# Patient Record
Sex: Female | Born: 1990 | Race: Black or African American | Hispanic: No | Marital: Married | Smoking: Never smoker
Health system: Southern US, Community
[De-identification: ages and names within clinical notes are randomized; demographics above are authoritative.]

## PROBLEM LIST (undated history)

## (undated) DIAGNOSIS — Z789 Other specified health status: Secondary | ICD-10-CM

## (undated) HISTORY — PX: NO PAST SURGERIES: SHX2092

## (undated) HISTORY — DX: Other specified health status: Z78.9

---
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
## 2011-05-25 ENCOUNTER — Emergency Department (INDEPENDENT_AMBULATORY_CARE_PROVIDER_SITE_OTHER)
Admission: EM | Admit: 2011-05-25 | Discharge: 2011-05-25 | Disposition: A | Discharge status: Home / Self Care | Payer: PRIVATE HEALTH INSURANCE | Source: Home / Self Care | Attending: Emergency Medicine | Admitting: Emergency Medicine

## 2011-05-25 ENCOUNTER — Encounter: Payer: Self-pay | Admitting: Emergency Medicine

## 2011-05-25 DIAGNOSIS — L03319 Cellulitis of trunk, unspecified: Secondary | ICD-10-CM

## 2011-05-25 DIAGNOSIS — L02215 Cutaneous abscess of perineum: Secondary | ICD-10-CM

## 2011-05-25 MED ORDER — SULFAMETHOXAZOLE-TRIMETHOPRIM 800-160 MG PO TABS: 1.0000 | ORAL_TABLET | Freq: Two times a day (BID) | ORAL | Status: AC

## 2011-05-25 MED ORDER — IBUPROFEN 400 MG PO TABS: 400.0000 mg | ORAL_TABLET | Freq: Four times a day (QID) | ORAL | Status: AC | PRN

## 2011-05-25 NOTE — ED Provider Notes (Signed)
History     CSN: [id number]  Arrival date & time 05/25/11  1706   First MD Initiated Contact with Patient 05/25/11 1709      Chief Complaint  Patient presents with  . Recurrent Skin Infections    (Consider location/radiation/quality/duration/timing/severity/associated sxs/prior treatment) HPI Comments: Have had other abscesses, in the same are, but they usually resolve on their own" No fevers, and this one is not draining  Patient is a 21 y.o. female presenting with abscess. The history is provided by the patient.  Abscess  This is a new problem. The problem has been gradually worsening. The problem is moderate. The abscess is characterized by redness, draining and swelling. Pertinent negatives include no anorexia and no fever.    History reviewed. No pertinent past medical history.  History reviewed. No pertinent past surgical history.  No family history on file.  History  Substance Use Topics  . Smoking status: Never Smoker   . Smokeless tobacco: Not on file  . Alcohol Use: No    OB History    Grav Para Term Preterm Abortions TAB SAB Ect Mult Living                  Review of Systems  Constitutional: Negative for fever.  Gastrointestinal: Negative for anorexia.  Skin:       Cellulitis abscess    Allergies  Review of patient's allergies indicates no known allergies.  Home Medications   Current Outpatient Rx  Name Route Sig Dispense Refill  . IBUPROFEN 400 MG PO TABS Oral Take 1 tablet (400 mg total) by mouth every 6 (six) hours as needed for pain. 30 tablet 0  . SULFAMETHOXAZOLE-TRIMETHOPRIM 800-160 MG PO TABS Oral Take 1 tablet by mouth 2 (two) times daily. 14 tablet 0    BP 93/79  Pulse 118  Temp(Src) 99.4 F (37.4 C) (Oral)  Resp 20  SpO2 100%  LMP 05/23/2011  Physical Exam  Nursing note and vitals reviewed. Constitutional: She appears well-developed and well-nourished.  Abdominal: Soft. Hernia confirmed negative in the right inguinal area and  confirmed negative in the left inguinal area.  Genitourinary:     Lymphadenopathy:       Right: No inguinal adenopathy present.       Left: No inguinal adenopathy present.  Skin: There is erythema.    ED Course  INCISION AND DRAINAGE Performed by: Andres Bantz Authorized by: Jimmie Molly Consent: Verbal consent obtained. Consent given by: patient and parent Patient understanding: patient states understanding of the procedure being performed Patient identity confirmed: verbally with patient Type: abscess Body area: anogenital Anesthesia: local infiltration Local anesthetic: bupivacaine 0.5% without epinephrine Anesthetic total: 4 ml Patient sedated: no Scalpel size: 10 Incision type: single straight Complexity: complex Drainage: purulent Drainage amount: copious Wound treatment: wound left open Patient tolerance: Patient tolerated the procedure well with no immediate complications.   (including critical care time)  Labs Reviewed - No data to display No results found.   1. Abscess, perineum       MDM  L abscess, perineal area- recurrent seem to have had recurrent-infections same area before- seems to have appearance of an infected sebaceous cyst        Jimmie Molly, MD 05/25/11 [phone]

## 2011-05-25 NOTE — ED Notes (Signed)
Boil/abscess , right labia.  Large, red painful area

## 2011-05-28 LAB — CULTURE, ROUTINE-GENITAL

## 2016-01-08 LAB — OB RESULTS CONSOLE GC/CHLAMYDIA
Chlamydia: NEGATIVE
Gonorrhea: NEGATIVE

## 2016-01-08 LAB — OB RESULTS CONSOLE RUBELLA ANTIBODY, IGM: Rubella: IMMUNE

## 2016-01-08 LAB — OB RESULTS CONSOLE HIV ANTIBODY (ROUTINE TESTING): HIV: NONREACTIVE

## 2016-01-08 LAB — OB RESULTS CONSOLE ABO/RH: RH TYPE: POSITIVE

## 2016-01-08 LAB — OB RESULTS CONSOLE RPR: RPR: NONREACTIVE

## 2016-01-08 LAB — OB RESULTS CONSOLE ANTIBODY SCREEN: Antibody Screen: NEGATIVE

## 2016-01-08 LAB — OB RESULTS CONSOLE HEPATITIS B SURFACE ANTIGEN: HEP B S AG: NEGATIVE

## 2016-05-20 NOTE — L&D Delivery Note (Signed)
Delivery Note Pt reassessed when deep variables returned and found to have a reducible lip.  Given the tracing, we began pushing and the lip reduced.  She pushed very well for about 20 minutes and brought the vertex to crowning.  At 5:38 PM a viable female was delivered via  (Presentation: OA).  APGAR: 8,9 ; weight pending .   Placenta status:  Delivered spontaneously .  Cord:  with the following complications: tight nuchal x 1 delivered through.  Baby with spontaneous cry and waited for delayed cord clamping. Anesthesia:  epidural Episiotomy:  none Lacerations:  First degree vaginal Suture Repair: 3.0 vicryl rapide for hemostasis Est. Blood Loss (mL):  225ml  Mom to postpartum.  Baby to Couplet care / Skin to Skin. D/w parents circumcision and they decline   Brandi Gross W 07/30/2016, 6:42 PM

## 2016-07-12 LAB — OB RESULTS CONSOLE GBS: GBS: POSITIVE

## 2016-07-29 ENCOUNTER — Encounter (HOSPITAL_COMMUNITY): Payer: Self-pay | Admitting: *Deleted

## 2016-07-29 ENCOUNTER — Telehealth (HOSPITAL_COMMUNITY): Payer: Self-pay | Admitting: *Deleted

## 2016-07-29 NOTE — Telephone Encounter (Signed)
Preadmission screen  

## 2016-07-30 ENCOUNTER — Inpatient Hospital Stay (HOSPITAL_COMMUNITY): Payer: Medicaid Other | Admitting: Anesthesiology

## 2016-07-30 ENCOUNTER — Encounter (HOSPITAL_COMMUNITY): Payer: Self-pay

## 2016-07-30 ENCOUNTER — Inpatient Hospital Stay (HOSPITAL_COMMUNITY)
Admission: AD | Admit: 2016-07-30 | Discharge: 2016-08-01 | DRG: 775 | Disposition: A | Discharge status: Home / Self Care | Payer: Medicaid Other | Source: Ambulatory Visit | Attending: Obstetrics and Gynecology | Admitting: Obstetrics and Gynecology

## 2016-07-30 DIAGNOSIS — Z3A38 38 weeks gestation of pregnancy: Secondary | ICD-10-CM

## 2016-07-30 DIAGNOSIS — O4292 Full-term premature rupture of membranes, unspecified as to length of time between rupture and onset of labor: Secondary | ICD-10-CM | POA: Diagnosis present

## 2016-07-30 DIAGNOSIS — O99824 Streptococcus B carrier state complicating childbirth: Secondary | ICD-10-CM | POA: Diagnosis present

## 2016-07-30 LAB — POCT FERN TEST: POCT Fern Test: POSITIVE

## 2016-07-30 LAB — ABO/RH: ABO/RH(D): O POS

## 2016-07-30 LAB — CBC
HEMATOCRIT: 35 % — AB (ref 36.0–46.0)
Hemoglobin: 11.5 g/dL — ABNORMAL LOW (ref 12.0–15.0)
MCH: 29.1 pg (ref 26.0–34.0)
MCHC: 32.9 g/dL (ref 30.0–36.0)
MCV: 88.6 fL (ref 78.0–100.0)
PLATELETS: 250 10*3/uL (ref 150–400)
RBC: 3.95 MIL/uL (ref 3.87–5.11)
RDW: 12.8 % (ref 11.5–15.5)
WBC: 8.3 10*3/uL (ref 4.0–10.5)

## 2016-07-30 LAB — TYPE AND SCREEN
ABO/RH(D): O POS
Antibody Screen: NEGATIVE

## 2016-07-30 MED ORDER — COCONUT OIL OIL
1.0000 "application " | TOPICAL_OIL | Status: DC | PRN
  Administered 2016-07-31: 1 via TOPICAL
  Filled 2016-07-30: qty 120

## 2016-07-30 MED ORDER — BUTORPHANOL TARTRATE 1 MG/ML IJ SOLN
1.0000 mg | INTRAMUSCULAR | Status: DC | PRN
  Administered 2016-07-30: 1 mg via INTRAVENOUS
  Filled 2016-07-30: qty 1

## 2016-07-30 MED ORDER — TERBUTALINE SULFATE 1 MG/ML IJ SOLN
0.2500 mg | Freq: Once | INTRAMUSCULAR | Status: DC | PRN
  Filled 2016-07-30: qty 1

## 2016-07-30 MED ORDER — SOD CITRATE-CITRIC ACID 500-334 MG/5ML PO SOLN
30.0000 mL | ORAL | Status: DC | PRN
  Filled 2016-07-30: qty 15

## 2016-07-30 MED ORDER — EPHEDRINE 5 MG/ML INJ
10.0000 mg | INTRAVENOUS | Status: DC | PRN
  Filled 2016-07-30: qty 4

## 2016-07-30 MED ORDER — BENZOCAINE-MENTHOL 20-0.5 % EX AERO
1.0000 "application " | INHALATION_SPRAY | CUTANEOUS | Status: DC | PRN
  Filled 2016-07-30: qty 56

## 2016-07-30 MED ORDER — LACTATED RINGERS IV SOLN
500.0000 mL | INTRAVENOUS | Status: DC | PRN
  Administered 2016-07-30: 1000 mL via INTRAVENOUS

## 2016-07-30 MED ORDER — ZOLPIDEM TARTRATE 5 MG PO TABS: 5.0000 mg | ORAL_TABLET | Freq: Every evening | ORAL | Status: DC | PRN

## 2016-07-30 MED ORDER — ONDANSETRON HCL 4 MG/2ML IJ SOLN
4.0000 mg | Freq: Four times a day (QID) | INTRAMUSCULAR | Status: DC | PRN
  Administered 2016-07-30: 4 mg via INTRAVENOUS
  Filled 2016-07-30: qty 2

## 2016-07-30 MED ORDER — ONDANSETRON HCL 4 MG PO TABS: 4.0000 mg | ORAL_TABLET | ORAL | Status: DC | PRN

## 2016-07-30 MED ORDER — SENNOSIDES-DOCUSATE SODIUM 8.6-50 MG PO TABS
2.0000 | ORAL_TABLET | ORAL | Status: DC
  Administered 2016-07-30 – 2016-07-31 (×2): 2 via ORAL
  Filled 2016-07-30 (×2): qty 2

## 2016-07-30 MED ORDER — PENICILLIN G POT IN DEXTROSE 60000 UNIT/ML IV SOLN
3.0000 10*6.[IU] | INTRAVENOUS | Status: DC
  Administered 2016-07-30 (×3): 3 10*6.[IU] via INTRAVENOUS
  Filled 2016-07-30 (×6): qty 50

## 2016-07-30 MED ORDER — LACTATED RINGERS IV SOLN
INTRAVENOUS | Status: DC
  Administered 2016-07-30: 07:00:00 via INTRAUTERINE

## 2016-07-30 MED ORDER — OXYCODONE HCL 5 MG PO TABS: 10.0000 mg | ORAL_TABLET | ORAL | Status: DC | PRN

## 2016-07-30 MED ORDER — FENTANYL 2.5 MCG/ML BUPIVACAINE 1/10 % EPIDURAL INFUSION (WH - ANES): 14.0000 mL/h | INTRAMUSCULAR | Status: DC | PRN

## 2016-07-30 MED ORDER — FLEET ENEMA 7-19 GM/118ML RE ENEM: 1.0000 | ENEMA | RECTAL | Status: DC | PRN

## 2016-07-30 MED ORDER — FENTANYL 2.5 MCG/ML BUPIVACAINE 1/10 % EPIDURAL INFUSION (WH - ANES)
INTRAMUSCULAR | Status: AC
  Filled 2016-07-30: qty 100

## 2016-07-30 MED ORDER — OXYCODONE-ACETAMINOPHEN 5-325 MG PO TABS: 1.0000 | ORAL_TABLET | ORAL | Status: DC | PRN

## 2016-07-30 MED ORDER — OXYTOCIN 40 UNITS IN LACTATED RINGERS INFUSION - SIMPLE MED: 1.0000 m[IU]/min | INTRAVENOUS | Status: DC

## 2016-07-30 MED ORDER — TETANUS-DIPHTH-ACELL PERTUSSIS 5-2.5-18.5 LF-MCG/0.5 IM SUSP: 0.5000 mL | Freq: Once | INTRAMUSCULAR | Status: DC

## 2016-07-30 MED ORDER — PHENYLEPHRINE 40 MCG/ML (10ML) SYRINGE FOR IV PUSH (FOR BLOOD PRESSURE SUPPORT)
80.0000 ug | PREFILLED_SYRINGE | INTRAVENOUS | Status: DC | PRN
  Filled 2016-07-30: qty 5

## 2016-07-30 MED ORDER — PRENATAL MULTIVITAMIN CH
1.0000 | ORAL_TABLET | Freq: Every day | ORAL | Status: DC
  Filled 2016-07-30 (×2): qty 1

## 2016-07-30 MED ORDER — LACTATED RINGERS IV SOLN
INTRAVENOUS | Status: DC
Start: 2016-07-30 — End: 2016-07-30
  Administered 2016-07-30: 02:00:00 via INTRAVENOUS

## 2016-07-30 MED ORDER — ONDANSETRON HCL 4 MG/2ML IJ SOLN: 4.0000 mg | INTRAMUSCULAR | Status: DC | PRN

## 2016-07-30 MED ORDER — LACTATED RINGERS IV SOLN: 500.0000 mL | Freq: Once | INTRAVENOUS | Status: DC

## 2016-07-30 MED ORDER — PHENYLEPHRINE 40 MCG/ML (10ML) SYRINGE FOR IV PUSH (FOR BLOOD PRESSURE SUPPORT)
PREFILLED_SYRINGE | INTRAVENOUS | Status: AC
  Filled 2016-07-30: qty 10

## 2016-07-30 MED ORDER — LACTATED RINGERS IV SOLN: INTRAVENOUS | Status: DC

## 2016-07-30 MED ORDER — FENTANYL 2.5 MCG/ML BUPIVACAINE 1/10 % EPIDURAL INFUSION (WH - ANES)
14.0000 mL/h | INTRAMUSCULAR | Status: DC | PRN
  Administered 2016-07-30 (×2): 14 mL/h via EPIDURAL
  Filled 2016-07-30 (×2): qty 100

## 2016-07-30 MED ORDER — OXYTOCIN 40 UNITS IN LACTATED RINGERS INFUSION - SIMPLE MED: 2.5000 [IU]/h | INTRAVENOUS | Status: DC

## 2016-07-30 MED ORDER — LIDOCAINE HCL (PF) 1 % IJ SOLN
30.0000 mL | INTRAMUSCULAR | Status: DC | PRN
  Filled 2016-07-30: qty 30

## 2016-07-30 MED ORDER — ACETAMINOPHEN 325 MG PO TABS: 650.0000 mg | ORAL_TABLET | ORAL | Status: DC | PRN

## 2016-07-30 MED ORDER — IBUPROFEN 600 MG PO TABS
600.0000 mg | ORAL_TABLET | Freq: Four times a day (QID) | ORAL | Status: DC
  Filled 2016-07-30: qty 1

## 2016-07-30 MED ORDER — DIPHENHYDRAMINE HCL 25 MG PO CAPS: 25.0000 mg | ORAL_CAPSULE | Freq: Four times a day (QID) | ORAL | Status: DC | PRN

## 2016-07-30 MED ORDER — OXYTOCIN BOLUS FROM INFUSION
500.0000 mL | Freq: Once | INTRAVENOUS | Status: AC
  Administered 2016-07-30: 500 mL via INTRAVENOUS

## 2016-07-30 MED ORDER — OXYCODONE-ACETAMINOPHEN 5-325 MG PO TABS: 2.0000 | ORAL_TABLET | ORAL | Status: DC | PRN

## 2016-07-30 MED ORDER — LIDOCAINE HCL (PF) 1 % IJ SOLN
INTRAMUSCULAR | Status: DC | PRN
  Administered 2016-07-30 (×2): 4 mL

## 2016-07-30 MED ORDER — OXYTOCIN 40 UNITS IN LACTATED RINGERS INFUSION - SIMPLE MED
1.0000 m[IU]/min | INTRAVENOUS | Status: DC
  Administered 2016-07-30: 2 m[IU]/min via INTRAVENOUS
  Filled 2016-07-30: qty 1000

## 2016-07-30 MED ORDER — DIPHENHYDRAMINE HCL 50 MG/ML IJ SOLN: 12.5000 mg | INTRAMUSCULAR | Status: DC | PRN

## 2016-07-30 MED ORDER — PENICILLIN G POTASSIUM 5000000 UNITS IJ SOLR
5.0000 10*6.[IU] | Freq: Once | INTRAVENOUS | Status: AC
  Administered 2016-07-30: 5 10*6.[IU] via INTRAVENOUS
  Filled 2016-07-30: qty 5

## 2016-07-30 MED ORDER — SIMETHICONE 80 MG PO CHEW
80.0000 mg | CHEWABLE_TABLET | ORAL | Status: DC | PRN
Start: 2016-07-30 — End: 2016-08-01

## 2016-07-30 MED ORDER — DIBUCAINE 1 % RE OINT: 1.0000 "application " | TOPICAL_OINTMENT | RECTAL | Status: DC | PRN

## 2016-07-30 MED ORDER — OXYCODONE HCL 5 MG PO TABS: 5.0000 mg | ORAL_TABLET | ORAL | Status: DC | PRN

## 2016-07-30 MED ORDER — WITCH HAZEL-GLYCERIN EX PADS: 1.0000 "application " | MEDICATED_PAD | CUTANEOUS | Status: DC | PRN

## 2016-07-30 MED ORDER — ACETAMINOPHEN 325 MG PO TABS
650.0000 mg | ORAL_TABLET | ORAL | Status: DC | PRN
  Filled 2016-07-30: qty 2

## 2016-07-30 NOTE — Progress Notes (Signed)
Patient ID: Brandi Gross, female   DOB: November 22, 1990, 26 y.o.   MRN: [id number] Pt received epidural and is comfortable, amnioinfusion begun and FSE applied by Dr. Rosario AdieBanga  afeb vss Cervix 70/4-5/-3  FHR Category 2 with intermittent deep variable decels, still with variability and some accels  Pt has O2 in place and new position tried.  Will observe closely and if strip does not improve or worsens d/w pt possible need for c-section.  Pt is not on pitocin  Has received GBS prophylaxis

## 2016-07-30 NOTE — Progress Notes (Signed)
Patient ID: Brandi Gross, female   DOB: March 20, 1991, 26 y.o.   MRN: [id number] Pt comfortable  FHR improved, still having intermittent variable decels but not persistent good variability and good scalp stim, +accels  Cervix 5/70/-1 Contractions not adequate Will try to augment with pitocin again now that strip improved. Follow closely

## 2016-07-30 NOTE — Progress Notes (Signed)
Patient ID: Brandi Gross, female   DOB: 12/06/1990, 26 y.o.   MRN: [id number] Pt comfortable  afeb vss FHR with decels again +scalp stim and good variability Will attempt repositioning and see if can improve  70/5/-1  Pitocin at 3 mu

## 2016-07-30 NOTE — Progress Notes (Signed)
Patient ID: Brandi Gross, female   DOB: 1990-10-26, 26 y.o.   MRN: [id number] I have been closely monitoring the FHR throughout the afternoon with constant assessment both at bedside and remotely.    The FHR has had intermittent deep variable decelerations, but not persistent with every contraction.   Good variability and +scalp stim with intermittent accelerations.  Pt had several deep decelerations at 400pm and I went to bedside to assess if needed to proceed with c-section.  Cervix at that point noted to be 9.5cm/80/0 station  Since progressing more rapidly we placed patient in new position and FHR improved.  Will try to give it a bit more time for complete dilation if FHR remains stable.  Have d/w pt that I have a low threshold for proceeding to c-section if the FHR becomes a category 3 tracing. Currently category 2.

## 2016-07-30 NOTE — Anesthesia Preprocedure Evaluation (Signed)
Anesthesia Evaluation  Patient identified by MRN, date of birth, ID band Patient awake    Reviewed: Allergy & Precautions, NPO status , Patient's Chart, lab work & pertinent test results  Airway Mallampati: II  TM Distance: >3 FB Neck ROM: Full    Dental no notable dental hx.    Pulmonary neg pulmonary ROS,    Pulmonary exam normal breath sounds clear to auscultation       Cardiovascular negative cardio ROS Normal cardiovascular exam Rhythm:Regular Rate:Normal     Neuro/Psych negative neurological ROS  negative psych ROS   GI/Hepatic negative GI ROS, Neg liver ROS,   Endo/Other  negative endocrine ROS  Renal/GU negative Renal ROS     Musculoskeletal negative musculoskeletal ROS (+)   Abdominal   Peds  Hematology negative hematology ROS (+)   Anesthesia Other Findings   Reproductive/Obstetrics (+) Pregnancy                             Anesthesia Physical Anesthesia Plan  ASA: II  Anesthesia Plan: Epidural   Post-op Pain Management:    Induction:   Airway Management Planned:   Additional Equipment:   Intra-op Plan:   Post-operative Plan:   Informed Consent: I have reviewed the patients History and Physical, chart, labs and discussed the procedure including the risks, benefits and alternatives for the proposed anesthesia with the patient or authorized representative who has indicated his/her understanding and acceptance.   Dental advisory given  Plan Discussed with: CRNA  Anesthesia Plan Comments:         Anesthesia Quick Evaluation

## 2016-07-30 NOTE — MAU Note (Signed)
Pt presents complaining of contractions for the last hour and a half. States she thinks she is leaking fluid but isn't sure. Denies bleeding. Reports good fetal movement.

## 2016-07-30 NOTE — H&P (Signed)
Brandi Gross is a 26 y.o. female presenting for spontaneous rupture of membranes. Pt reported leakage of fluid that begun shortly before midnight. On arrival at MAU pt confirmed SROm - clear fluid; tolerable contractions. Pt is dated based on lmp and confirmed by 9wk [place]. She has had a benign prenatal course. First trimester screen and essential panel neg. GBS positive  . OB History    Gravida Para Term Preterm AB Living   1             SAB TAB Ectopic Multiple Live Births                 Past Medical History:  Diagnosis Date  . Medical history non-contributory    Past Surgical History:  Procedure Laterality Date  . NO PAST SURGERIES     Family History: family history includes Cancer in her maternal aunt and maternal grandmother. Social History:  reports that she has never smoked. She has never used smokeless tobacco. She reports that she does not drink alcohol or use drugs.     Maternal Diabetes: No Genetic Screening: Normal Maternal Ultrasounds/Referrals: Normal Fetal Ultrasounds or other Referrals:  None Maternal Substance Abuse:  No Significant Maternal Medications:  None Significant Maternal Lab Results:  Lab values include: Group B Strep positive Other Comments:  None  Review of Systems  Constitutional: Negative for chills, fever, malaise/fatigue and weight loss.  Eyes: Negative for blurred vision.  Respiratory: Negative for shortness of breath.   Cardiovascular: Negative for chest pain.  Gastrointestinal: Negative for abdominal pain, heartburn, nausea and vomiting.  Genitourinary: Negative for dysuria.  Musculoskeletal: Negative for back pain.  Skin: Negative for itching and rash.  Neurological: Negative for dizziness and headaches.  Endo/Heme/Allergies: Does not bruise/bleed easily.  Psychiatric/Behavioral: Negative for depression, hallucinations, substance abuse and suicidal ideas. The patient is not nervous/anxious.    Maternal Medical History:  Reason for  admission: Rupture of membranes.  Nausea.  Contractions: Onset was 3-5 hours ago.   Frequency: irregular.   Duration is approximately 30 seconds.   Perceived severity is mild.    Fetal activity: Perceived fetal activity is normal.   Last perceived fetal movement was within the past hour.    Prenatal complications: no prenatal complications Prenatal Complications - Diabetes: none.    Dilation: 3 Effacement (%): 80 Station: -3 Exam by:: Banga, DO Blood pressure 127/76, pulse 83, temperature 98.7 F (37.1 C), temperature source Oral, resp. rate 16, height 5\' 4"  (1.626 m), weight 210 lb (95.3 kg), last menstrual period 11/02/2015. Maternal Exam:  Uterine Assessment: Contraction strength is mild.  Contraction duration is 30 seconds. Contraction frequency is irregular.   Abdomen: Patient reports generalized tenderness.  Estimated fetal weight is AGA.   Fetal presentation: vertex  Introitus: Normal vulva. Normal vagina.  Ferning test: positive.  Nitrazine test: positive. Amniotic fluid character: clear.  Pelvis: adequate for delivery.   Cervix: Cervix evaluated by digital exam.     Physical Exam  Constitutional: She appears well-developed and well-nourished.  Neck: Normal range of motion.  Cardiovascular: Normal rate.   Respiratory: Effort normal.  GI: Soft. There is generalized tenderness.  Genitourinary: Vagina normal and uterus normal.  Musculoskeletal: Normal range of motion.  Neurological: She is alert.  Skin: Skin is warm.  Psychiatric: She has a normal mood and affect. Her behavior is normal. Judgment and thought content normal.    Prenatal labs: ABO, Rh: --/--/O POS (03/13 0125) Antibody: NEG (03/13 0125) Rubella: Immune (08/21 0000) RPR:  Nonreactive (08/21 0000)  HBsAg: Negative (08/21 0000)  HIV: Non-reactive (08/21 0000)  GBS:     Assessment/Plan: G1P0 at 38 5/[redacted]wks gestation with SROM Pitocin for augmentation PCN for GBS treatment Stadol for pain  control prn Anticipate svd   [facility] HospitalCecilia Worema Banga 07/30/2016, 3:58 AM

## 2016-07-30 NOTE — Progress Notes (Signed)
Patient ID: Brandi Gross, female   DOB: 08/19/1990, 26 y.o.   MRN: [id number] Vtx by informal BS US.

## 2016-07-30 NOTE — Anesthesia Pain Management Evaluation Note (Signed)
  CRNA Pain Management Visit Note  Patient: Brandi Gross, 26 y.o., female  "Hello I am a member of the anesthesia team at [facility]. We have an anesthesia team available at all times to provide care throughout the hospital, including epidural management and anesthesia for C-section. I don't know your plan for the delivery whether it a natural birth, water birth, IV sedation, nitrous supplementation, doula or epidural, but we want to meet your pain goals."   1.Was your pain managed to your expectations on prior hospitalizations?   No prior hospitalizations  2.What is your expectation for pain management during this hospitalization?     Epidural  3.How can we help you reach that goal?   Record the patient's initial score and the patient's pain goal.   Pain: 0  Pain Goal: 2 [facility] wants you to be able to say your pain was always managed very well.  Laban EmperorMalinova,Camron Monday Hristova 07/30/2016

## 2016-07-30 NOTE — Progress Notes (Signed)
Patient ID: Brandi Gross, female   DOB: 01-28-1991, 26 y.o.   MRN: [id number] Pt with recurrent variables and late decels with contractions with pitocin at 2mus. Decreased pitocin to 1mu and strip appeared to improve.   I was called 15mins later and informed pt had one prolonged decel into the 60s for 2-223mins.  Pitocin stopped and O2 via mask started as well as a LR bolus started  I arrived and noted decels had resoved over past 8mins. I placed an FSE and IUPC.  SVE - 4/100/-3; ballotable  Pt requesting epidural

## 2016-07-31 LAB — CBC
HCT: 30 % — ABNORMAL LOW (ref 36.0–46.0)
Hemoglobin: 10.2 g/dL — ABNORMAL LOW (ref 12.0–15.0)
MCH: 30.2 pg (ref 26.0–34.0)
MCHC: 34 g/dL (ref 30.0–36.0)
MCV: 88.8 fL (ref 78.0–100.0)
PLATELETS: 218 10*3/uL (ref 150–400)
RBC: 3.38 MIL/uL — AB (ref 3.87–5.11)
RDW: 13.2 % (ref 11.5–15.5)
WBC: 14.4 10*3/uL — AB (ref 4.0–10.5)

## 2016-07-31 LAB — RPR: RPR Ser Ql: NONREACTIVE

## 2016-07-31 MED ORDER — IBUPROFEN 100 MG/5ML PO SUSP
600.0000 mg | Freq: Four times a day (QID) | ORAL | Status: DC
  Administered 2016-07-31 – 2016-08-01 (×7): 600 mg via ORAL
  Filled 2016-07-31 (×7): qty 30

## 2016-07-31 NOTE — Lactation Note (Signed)
This note was copied from a baby's chart. Lactation Consultation Note: Lactation brochure given to mother with basic breastfeeding teaching done. Assist mother with latching infant on in football hold. Observed audible swallows. Infant lips slightly rolled inward . Taught mother how to adjust infants lower jaw for wider gape. Infant latched better without any pinching. Advised mother to use good support with pillows and to roll infant tummy to tummy . Advised mother to breastfeed 8-12 times in 24 hours and feed infant with feeding cues. Discussed cluster feeding. Infant has been feeding well today. Advised mother to keep good account of I/O. Mother receptive to all teaching.   Patient Name: Brandi Gross ZOXWR'UToday's Date: 07/31/2016     Maternal Data    Feeding Feeding Type: Breast Fed Length of feed: 10 min  LATCH Score/Interventions Latch: Grasps breast easily, tongue down, lips flanged, rhythmical sucking. Intervention(s): Skin to skin Intervention(s): Assist with latch;Breast compression  Audible Swallowing: Spontaneous and intermittent  Type of Nipple: Everted at rest and after stimulation  Comfort (Breast/Nipple): Filling, red/small blisters or bruises, mild/mod discomfort  Problem noted: Mild/Moderate discomfort (mild discomfort on the (L) nipple) Interventions (Mild/moderate discomfort): Hand expression  Hold (Positioning): Assistance needed to correctly position infant at breast and maintain latch. Intervention(s): Support Pillows;Position options  LATCH Score: 8  Lactation Tools Discussed/Used     Consult Status      Brandi Gross, Brandi Gross 07/31/2016, 3:50 PM

## 2016-07-31 NOTE — Progress Notes (Signed)
PPD #1 No problems Afeb, VSS Fundus firm, NT at U-1 Continue routine postpartum care 

## 2016-07-31 NOTE — Anesthesia Postprocedure Evaluation (Signed)
Anesthesia Post Note  Patient: Brandi Gross  Procedure(s) Performed: * No procedures listed *  Patient location during evaluation: Mother Baby Anesthesia Type: Epidural Level of consciousness: awake and alert Pain management: satisfactory to patient Vital Signs Assessment: post-procedure vital signs reviewed and stable Respiratory status: respiratory function stable Cardiovascular status: stable Postop Assessment: no headache, no backache, epidural receding, patient able to bend at knees, no signs of nausea or vomiting and adequate PO intake Anesthetic complications: no        Last Vitals:  Vitals:   07/30/16 2338 07/31/16 0640  BP: 124/63 (!) 97/45  Pulse: 100 68  Resp: 18 18  Temp: 37.2 C 36.7 C    Last Pain:  Vitals:   07/31/16 0850  TempSrc:   PainSc: 0-No pain   Pain Goal: Patients Stated Pain Goal: 2 (07/30/16 0219)               Karleen DolphinFUSSELL,Stpehen Petitjean

## 2016-08-01 ENCOUNTER — Inpatient Hospital Stay (HOSPITAL_COMMUNITY): Admission: RE | Admit: 2016-08-01 | Payer: PRIVATE HEALTH INSURANCE | Source: Ambulatory Visit

## 2016-08-01 MED ORDER — PRENATAL MULTIVITAMIN CH: 1.0000 | ORAL_TABLET | Freq: Every day | ORAL | 3 refills | Status: AC

## 2016-08-01 MED ORDER — IBUPROFEN 100 MG/5ML PO SUSP: 800.0000 mg | Freq: Three times a day (TID) | ORAL | 1 refills | Status: DC | PRN

## 2016-08-01 NOTE — Progress Notes (Signed)
Post Partum Day 2 Subjective: no complaints, up ad lib, voiding, tolerating PO and nl lochia, pain controlled  Objective: Blood pressure (!) 101/52, pulse 86, temperature 97.7 F (36.5 C), resp. rate 18, height 5\' 4"  (1.626 m), weight 95.3 kg (210 lb), last menstrual period 11/02/2015, SpO2 100 %, unknown if currently breastfeeding.  Physical Exam:  General: alert and no distress Lochia: appropriate Uterine Fundus: firm   Recent Labs  07/30/16 0125 07/31/16 0542  HGB 11.5* 10.2*  HCT 35.0* 30.0*    Assessment/Plan: Discharge home.  Routine care.  D/c with motrin and PNV, f/u 6 weeks   LOS: 2 days   Bovard-Stuckert, Milinda Sweeney 08/01/2016, 8:43 AM

## 2016-08-01 NOTE — Discharge Summary (Signed)
OB Discharge Summary     Patient Name: Brandi Gross DOB: [birth year] MRN: [id number]  Date of admission: 07/30/2016 Delivering MD: Huel Cote   Date of discharge: 08/01/2016  Admitting diagnosis: 38 WEEKS CTX Intrauterine pregnancy: [redacted]w[redacted]d     Secondary diagnosis:  Active Problems:   Normal labor   NSVD (normal spontaneous vaginal delivery)  Additional problems: N/A     Discharge diagnosis: Term Pregnancy Delivered                                                                                                Post partum procedures:N/A  Augmentation: Pitocin  Complications: None  Hospital course:  Onset of Labor With Vaginal Delivery     26 y.o. yo G1P1001 at [redacted]w[redacted]d was admitted in Active Labor on 07/30/2016. Patient had an uncomplicated labor course as follows:  Membrane Rupture Time/Date: 9:45 PM ,07/29/2016   Intrapartum Procedures: Episiotomy: None [1]                                         Lacerations:  None [1]  Patient had a delivery of a Viable infant. 07/30/2016  Information for the patient's newborn:  Alaysha, Jefcoat [[id number]]  Delivery Method: Vaginal, Spontaneous Delivery (Filed from Delivery Summary)    Pateint had an uncomplicated postpartum course.  She is ambulating, tolerating a regular diet, passing flatus, and urinating well. Patient is discharged home in stable condition on 08/01/16.   Physical exam  Vitals:   07/30/16 2338 07/31/16 0640 07/31/16 1855 08/01/16 0532  BP: 124/63 (!) 97/45 103/63 (!) 101/52  Pulse: 100 68 77 86  Resp: 18 18 18 18   Temp: 98.9 F (37.2 C) 98 F (36.7 C) 97.7 F (36.5 C) 97.7 F (36.5 C)  TempSrc: Oral  Oral   SpO2:      Weight:      Height:       General: alert and no distress Lochia: appropriate Uterine Fundus: firm  Labs: Lab Results  Component Value Date   WBC 14.4 (H) 07/31/2016   HGB 10.2 (L) 07/31/2016   HCT 30.0 (L) 07/31/2016   MCV 88.8 07/31/2016   PLT 218 07/31/2016   No flowsheet  data found.  Discharge instruction: per After Visit Summary and "Baby and Me Booklet".  After visit meds:  Allergies as of 08/01/2016   No Known Allergies     Medication List    TAKE these medications   ibuprofen 100 MG/5ML suspension Commonly known as:  ADVIL,MOTRIN Take 40 mLs (800 mg total) by mouth every 8 (eight) hours as needed.   prenatal multivitamin Tabs tablet Take 1 tablet by mouth daily at 12 noon.       Diet: routine diet  Activity: Advance as tolerated. Pelvic rest for 6 weeks.   Outpatient follow up:6 weeks Follow up Appt:No future appointments. Follow up Visit:No Follow-up on file.  Postpartum contraception: Undecided  Newborn Data: Live born female  Birth Weight: 5 lb 11 oz (2580 g) APGAR:  8, 9  Baby Feeding: Breast Disposition:home with mother   08/01/2016 Sherian ReinBovard-Stuckert, Paymon Rosensteel, MD

## 2016-08-01 NOTE — Addendum Note (Signed)
Addendum  created 08/01/16 1520 by Lowella CurbWarren Ray Sheree Lalla, MD   Anesthesia Staff edited

## 2016-08-01 NOTE — Lactation Note (Signed)
This note was copied from a baby's chart. Lactation Consultation Note:  Mother has bilateral cracks, at the back of the nipple shaft. Mother was given comfort gels. Mother ask to be fit with a nipple shield. Informed mother that the nipple shield can be a barrier. Mother was fit with a #20 nipple shield. Mother taught how to apply nipple shield. Infant latched on the shield with wide open gape. Suggested that mother limit use of the pacifier. Advised mother to post pump 1-2 times daily if she continues to use the shield. Mother was advised to do good breast massage and ice breast to reduce swelling and aid in better milk flow. Mother has an electric pump of her own. Mother is aware of available LC services , OP, BFSG and phone number to reach [facility] for questions or concerns.   Patient Name: Brandi Gross'MToday's Date: 08/01/2016 Reason for consult: Follow-up assessment   Maternal Data    Feeding Feeding Type: Breast Fed  LATCH Score/Interventions Latch: Grasps breast easily, tongue down, lips flanged, rhythmical sucking. Intervention(s): Adjust position;Assist with latch;Breast compression  Audible Swallowing: A few with stimulation Intervention(s): Hand expression  Type of Nipple: Everted at rest and after stimulation  Comfort (Breast/Nipple): Filling, red/small blisters or bruises, mild/mod discomfort (mother has crack on left nipple)  Problem noted: Cracked, bleeding, blisters, bruises Interventions (Mild/moderate discomfort): Hand massage;Comfort gels  Hold (Positioning): No assistance needed to correctly position infant at breast.  LATCH Score: 8  Lactation Tools Discussed/Used Tools: Nipple Shields Nipple shield size: 20   Consult Status      Michel BickersKendrick, Axelle Szwed McCoy 08/01/2016, 10:48 AM

## 2016-08-11 NOTE — Addendum Note (Signed)
Addendum  created 08/11/16 2051 by Lewie LoronJohn Lavina Resor, MD   Anesthesia Staff edited

## 2018-09-07 ENCOUNTER — Emergency Department (HOSPITAL_COMMUNITY): Payer: PRIVATE HEALTH INSURANCE

## 2018-09-07 ENCOUNTER — Encounter (HOSPITAL_COMMUNITY): Payer: Self-pay

## 2018-09-07 ENCOUNTER — Emergency Department (HOSPITAL_COMMUNITY)
Admission: EM | Admit: 2018-09-07 | Discharge: 2018-09-07 | Disposition: A | Discharge status: Home / Self Care | Payer: PRIVATE HEALTH INSURANCE | Attending: Emergency Medicine | Admitting: Emergency Medicine

## 2018-09-07 ENCOUNTER — Other Ambulatory Visit: Payer: Self-pay

## 2018-09-07 DIAGNOSIS — R0789 Other chest pain: Secondary | ICD-10-CM | POA: Diagnosis present

## 2018-09-07 DIAGNOSIS — M546 Pain in thoracic spine: Secondary | ICD-10-CM | POA: Diagnosis not present

## 2018-09-07 NOTE — ED Notes (Signed)
Pt returned from xray

## 2018-09-07 NOTE — ED Notes (Signed)
Ambulated in hallway with pt, spO2 did decrease from 99% at rest to 96% with ambulation, and pulse increased from 78 to 91bpm. Pt denies any dizziness, unsteadiness, shob, or palpitations. No difficulty with ambulation.

## 2018-09-07 NOTE — Discharge Instructions (Signed)
You were seen in the ED today for chest pain; your EKG and chest x ray were both negative. Please follow up with a primary care doctor regarding this; I have attached 2 facilities that you can call. RETURN TO THE ED FOR ANY RECURRING CHEST PAIN OR SHORTNESS OF BREATH.  Please also follow up with your OBGYN regarding your nexplanon insertion.

## 2018-09-07 NOTE — ED Provider Notes (Signed)
[facility] EMERGENCY DEPARTMENT Provider Note   CSN: [id number] Arrival date & time: 09/07/18  1219    History   Chief Complaint Chief Complaint  Patient presents with  . Chest Pain  . Neck Pain    HPI Brandi Gross is a 28 y.o. female who presents to the ED complaining of sudden onset, sharp, substernal chest pain that occurred last night for approximately 20 minutes. Pt states she was eating and immediately began having the pain; she attempted to take TUMS and ibuprofen without relief but states the pain eventually resolved on its own. She called Urgent Care who advised she come to the ED to be evaluated. Pt has not had any recurring episodes of chest pain since the 1 episode last night. No shortness of breath, nausea, vomiting, or diaphoresis with it. Pt recently had a baby 5 weeks ago for which she was in the hospital for 2 days. She also recently had a nexplanon implanted about 1 week ago.   Pt complaining of pain near her nexplanon site that has been there since she had it implanted about 1 week ago. She has not followed up with her OBGYN regarding the pain. She also currently complains of pain to her right upper back that she noticed yesterday after waking up. She reports sleeping in bed with her baby and believes she may have slept wrong, causing the pain.        Past Medical History:  Diagnosis Date  . Medical history non-contributory     Patient Active Problem List   Diagnosis Date Noted  . Normal labor 07/30/2016  . NSVD (normal spontaneous vaginal delivery) 07/30/2016  . OBESITY 08/13/2007    Past Surgical History:  Procedure Laterality Date  . NO PAST SURGERIES       OB History    Gravida  1   Para  1   Term  1   Preterm      AB      Living  1     SAB      TAB      Ectopic      Multiple  0   Live Births  1            Home Medications    Prior to Admission medications   Medication Sig Start Date End Date  Taking? Authorizing Provider  ibuprofen (ADVIL,MOTRIN) 100 MG/5ML suspension Take 40 mLs (800 mg total) by mouth every 8 (eight) hours as needed. 08/01/16   Bovard-Stuckert, Augusto Gamble, MD  Prenatal Vit-Fe Fumarate-FA (PRENATAL MULTIVITAMIN) TABS tablet Take 1 tablet by mouth daily at 12 noon. 08/01/16   Bovard-StuckertAugusto Gamble, MD    Family History Family History  Problem Relation Age of Onset  . Cancer Maternal Aunt        breast  . Cancer Maternal Grandmother        breast    Social History Social History   Tobacco Use  . Smoking status: Never Smoker  . Smokeless tobacco: Never Used  Substance Use Topics  . Alcohol use: No  . Drug use: No     Allergies   Patient has no known allergies.   Review of Systems Review of Systems  Constitutional: Negative for chills and fever.  HENT: Negative for congestion.   Eyes: Negative for redness.  Respiratory: Negative for cough and shortness of breath.   Cardiovascular: Positive for chest pain (resolved). Negative for palpitations and leg swelling.  Gastrointestinal: Negative for abdominal pain,  nausea and vomiting.  Genitourinary: Negative for dysuria and frequency.  Musculoskeletal: Positive for back pain. Negative for neck pain.  Skin: Negative for rash.  Neurological: Negative for weakness and numbness.     Physical Exam Updated Vital Signs BP 130/77   Pulse 90   Temp 98.6 F (37 C)   Resp (!) 24   Ht  (1.626 m)   Wt 95.3 kg   SpO2 100%   BMI 36.06 kg/m   Physical Exam Vitals signs and nursing note reviewed.  Constitutional:      Appearance: She is not ill-appearing.  HENT:     Head: Normocephalic and atraumatic.  Eyes:     Conjunctiva/sclera: Conjunctivae normal.  Neck:     Musculoskeletal: Neck supple.  Cardiovascular:     Rate and Rhythm: Normal rate and regular rhythm.     Pulses:          Radial pulses are 2+ on the right side and 2+ on the left side.       Dorsalis pedis pulses are 2+ on the right side  and 2+ on the left side.     Heart sounds: Normal heart sounds.  Pulmonary:     Effort: Pulmonary effort is normal.     Breath sounds: Normal breath sounds. No decreased breath sounds, wheezing, rhonchi or rales.  Chest:     Chest wall: No tenderness.  Abdominal:     Palpations: Abdomen is soft.     Tenderness: There is no abdominal tenderness.  Musculoskeletal:     Comments: No C, T, or L spine midline tenderness to palpation. No paraspinal tenderness to palpation as well. Full ROM of neck intact.   Skin:    General: Skin is warm and dry.  Neurological:     Mental Status: She is alert.      ED Treatments / Results  Labs (all labs ordered are listed, but only abnormal results are displayed) Labs Reviewed - No data to display  EKG EKG Interpretation  Date/Time:  Monday September 07 2018 12:29:27 EDT Ventricular Rate:  67 PR Interval:    QRS Duration: 90 QT Interval:  400 QTC Calculation: 423 R Axis:   60 Text Interpretation:  Sinus rhythm No old tracing to compare Confirmed by Rolan Bucco [phone]) on 09/07/2018 1:08:02 PM   Radiology Dg Chest 2 View  Result Date: 09/07/2018 CLINICAL DATA:  Chest pain EXAM: CHEST - 2 VIEW COMPARISON:  None. FINDINGS: Lungs are clear. Heart size and pulmonary vascularity are normal. No adenopathy. No pneumothorax. No bone lesions. IMPRESSION: No edema or consolidation. Electronically Signed   By: Bretta Bang III M.D.   On: 09/07/2018 14:11    Procedures Procedures (including critical care time)  Medications Ordered in ED Medications - No data to display   Initial Impression / Assessment and Plan / ED Course  I have reviewed the triage vital signs and the nursing notes.  Pertinent labs & imaging results that were available during my care of the patient were reviewed by me and considered in my medical decision making (see chart for details).    28 year old female with complaint of 1 episode of chest pain that lasted 20 minutes  last night. Recently had child 5 weeks ago; normal delivery and had nexplanon implanted last week. Pt satting 100% on RA; denies SOB with the chest pain last night. Discussed case with attending physician Dr. Fredderick Phenix; given pt is not having any symptoms at this time and  she only had 1 episode of chest pain last night that has since resolved without recurring pain there is low suspicion for PE. Pt had child about 5 weeks ago; although only hospitalized for 2 days and this is out of 4 week immobilization window. Will get pt to ambulate with pulse ox and get EKG/CXR.   EKG unremarkable; no ST segment changes. CXR clear. Pt was ambulated; her pulse ox stayed above 95% with exertion; do not feel pt needs work up for PE at this time. Will discharge home with PCP follow up. Pt in agreement with plan at this time and stable for discharge home.       Final Clinical Impressions(s) / ED Diagnoses   Final diagnoses:  Other chest pain    ED Discharge Orders    None       Tanda RockersVenter, Devanny Palecek, PA-C 09/07/18 1706    Rolan BuccoBelfi, Melanie, MD 09/12/18 [phone]

## 2018-09-07 NOTE — ED Triage Notes (Signed)
Pt reports CP last night, which has since resolved. Currently has pain near Nexplanon insertion site in L arm and in her neck

## 2018-09-07 NOTE — ED Notes (Signed)
Patient transported to X-ray 

## 2018-09-09 ENCOUNTER — Encounter (HOSPITAL_COMMUNITY): Payer: Self-pay | Admitting: Emergency Medicine

## 2018-09-09 ENCOUNTER — Emergency Department (HOSPITAL_COMMUNITY)
Admission: EM | Admit: 2018-09-09 | Discharge: 2018-09-09 | Disposition: A | Discharge status: Home / Self Care | Payer: PRIVATE HEALTH INSURANCE | Attending: Emergency Medicine | Admitting: Emergency Medicine

## 2018-09-09 ENCOUNTER — Other Ambulatory Visit: Payer: Self-pay

## 2018-09-09 DIAGNOSIS — M549 Dorsalgia, unspecified: Secondary | ICD-10-CM | POA: Diagnosis not present

## 2018-09-09 DIAGNOSIS — Z79899 Other long term (current) drug therapy: Secondary | ICD-10-CM | POA: Insufficient documentation

## 2018-09-09 DIAGNOSIS — R079 Chest pain, unspecified: Secondary | ICD-10-CM | POA: Insufficient documentation

## 2018-09-09 NOTE — ED Provider Notes (Signed)
[facility] EMERGENCY DEPARTMENT Provider Note   CSN: [id number] Arrival date & time: 09/09/18  1208    History   Chief Complaint No chief complaint on file.   HPI Brandi Gross is a 28 y.o. female.     HPI   28 year old female presents today with complaints of chest pain.  Patient notes symptoms started approximately 3 days ago with an ache in her chest. She denies any associated SOB. That episode lasted approximately 15 minutes and then completely resolved.  She followed up in the emergency room the following day.  She notes she had been asymptomatic.  She again went home and developed pain this morning.  She notes this is diffuse across her chest very minimal nonsevere and improving now.  She denies any associated shortness of breath, orthopnea, worsening with exertion.  She denies any pain across the chest with palpation, denies any pain with deep inspiration.  No lower extremity swelling or edema.  She has no significant past medical history of blood clots, estrogen use, smoking, cancer, recent trauma.  She did deliver a baby approximately 5 weeks ago, this was normal vaginal delivery with no complications she was hospitalized for 2 days and has been ambulatory since.  She denies any personal or family cardiac history or any significant risk factors.  She notes approximately 1 week ago she had Nexplanon placed.  She also reports that she is having minor thoracic back pain, not worse with palpation, not worse with movement, no associated neurological deficits.  Past Medical History:  Diagnosis Date  . Medical history non-contributory     Patient Active Problem List   Diagnosis Date Noted  . Normal labor 07/30/2016  . NSVD (normal spontaneous vaginal delivery) 07/30/2016  . OBESITY 08/13/2007    Past Surgical History:  Procedure Laterality Date  . NO PAST SURGERIES       OB History    Gravida  1   Para  1   Term  1   Preterm      AB      Living   1     SAB      TAB      Ectopic      Multiple  0   Live Births  1            Home Medications    Prior to Admission medications   Medication Sig Start Date End Date Taking? Authorizing Provider  ibuprofen (ADVIL,MOTRIN) 100 MG/5ML suspension Take 40 mLs (800 mg total) by mouth every 8 (eight) hours as needed. 08/01/16   Bovard-Stuckert, Augusto Gamble, MD  Prenatal Vit-Fe Fumarate-FA (PRENATAL MULTIVITAMIN) TABS tablet Take 1 tablet by mouth daily at 12 noon. 08/01/16   Bovard-StuckertAugusto Gamble, MD    Family History Family History  Problem Relation Age of Onset  . Cancer Maternal Aunt        breast  . Cancer Maternal Grandmother        breast    Social History Social History   Tobacco Use  . Smoking status: Never Smoker  . Smokeless tobacco: Never Used  Substance Use Topics  . Alcohol use: No  . Drug use: No     Allergies   Patient has no known allergies.   Review of Systems Review of Systems  All other systems reviewed and are negative.    Physical Exam Updated Vital Signs BP (!) 140/91   Pulse 74   Temp 99.1 F (37.3 C) (Oral)  Resp 18   Ht 5\' 4"  (1.626 m)   Wt 95.3 kg   SpO2 99%   Breastfeeding Yes Comment: bleedint postpartum still  BMI 36.06 kg/m   Physical Exam Vitals signs and nursing note reviewed.  Constitutional:      Appearance: She is well-developed.  HENT:     Head: Normocephalic and atraumatic.  Eyes:     General: No scleral icterus.       Right eye: No discharge.        Left eye: No discharge.     Conjunctiva/sclera: Conjunctivae normal.     Pupils: Pupils are equal, round, and reactive to light.  Neck:     Musculoskeletal: Normal range of motion.     Vascular: No JVD.     Trachea: No tracheal deviation.  Cardiovascular:     Rate and Rhythm: Normal rate and regular rhythm.  Pulmonary:     Effort: Pulmonary effort is normal. No respiratory distress.     Breath sounds: Normal breath sounds. No stridor. No wheezing, rhonchi or  rales.  Musculoskeletal:     Comments: No lower extremity swelling or edema  Neurological:     Mental Status: She is alert and oriented to person, place, and time.     Coordination: Coordination normal.  Psychiatric:        Behavior: Behavior normal.        Thought Content: Thought content normal.        Judgment: Judgment normal.      ED Treatments / Results  Labs (all labs ordered are listed, but only abnormal results are displayed) Labs Reviewed - No data to display  EKG EKG Interpretation  Date/Time:  Wednesday September 09 2018 12:32:15 EDT Ventricular Rate:  81 PR Interval:  148 QRS Duration: 84 QT Interval:  378 QTC Calculation: 439 R Axis:   71 Text Interpretation:  Normal sinus rhythm with sinus arrhythmia Normal ECG Confirmed by Virgina NorfolkAdam, [place] [phone]([id number]) on 09/09/2018 12:36:05 PM   Radiology Dg Chest 2 View  Result Date: 09/07/2018 CLINICAL DATA:  Chest pain EXAM: CHEST - 2 VIEW COMPARISON:  None. FINDINGS: Lungs are clear. Heart size and pulmonary vascularity are normal. No adenopathy. No pneumothorax. No bone lesions. IMPRESSION: No edema or consolidation. Electronically Signed   By: Bretta BangWilliam  Woodruff III M.D.   On: 09/07/2018 14:11    Procedures Procedures (including critical care time)  Medications Ordered in ED Medications - No data to display   Initial Impression / Assessment and Plan / ED Course  I have reviewed the triage vital signs and the nursing notes.  Pertinent labs & imaging results that were available during my care of the patient were reviewed by me and considered in my medical decision making (see chart for details).        28 year old female presents today with complaints of chest pain.  Patient is very well-appearing in no acute distress.  She is not having severe chest pain.  She has no significant signs or symptoms that would be consistent with ACS, PE, dissection, or any other life-threatening etiology.  She is PERC negative, she has had  2 evaluations within the week with reassuring work-up.  Her EKG is reassuring.  Question anxiety and stress related pain.  Patient will be discharged with instructions to use ibuprofen or Tylenol as needed for back pain, return immediately if she develops any new or worsening signs or symptoms.  She will follow-up as an outpatient with her primary care OB if  symptoms continue to persist.  She verbalized understanding and agreement to today's plan had no further questions or concerns at the time of discharge.  Final Clinical Impressions(s) / ED Diagnoses   Final diagnoses:  Chest pain, unspecified type    ED Discharge Orders    None       Eyvonne Mechanic, PA-C 09/09/18 1254    Virgina Norfolk, DO 09/09/18 1441

## 2018-09-09 NOTE — ED Triage Notes (Signed)
Pt reports central CP Sunday night, it went away, was checked Monday for new upper back pain, is here today because the CP is back.

## 2018-09-09 NOTE — ED Notes (Signed)
Discharge instructions discussed with Pt. Pt verbalized understanding. Pt stable and ambulatory.    

## 2018-09-09 NOTE — Discharge Instructions (Addendum)
Please read attached information. If you experience any new or worsening signs or symptoms please return to the emergency room for evaluation. Please follow-up with your primary care provider or specialist as discussed.  °

## 2018-10-12 ENCOUNTER — Emergency Department (HOSPITAL_BASED_OUTPATIENT_CLINIC_OR_DEPARTMENT_OTHER): Payer: PRIVATE HEALTH INSURANCE

## 2018-10-12 ENCOUNTER — Emergency Department (HOSPITAL_BASED_OUTPATIENT_CLINIC_OR_DEPARTMENT_OTHER)
Admission: EM | Admit: 2018-10-12 | Discharge: 2018-10-12 | Disposition: A | Discharge status: Home / Self Care | Payer: PRIVATE HEALTH INSURANCE | Attending: Emergency Medicine | Admitting: Emergency Medicine

## 2018-10-12 ENCOUNTER — Other Ambulatory Visit: Payer: Self-pay

## 2018-10-12 ENCOUNTER — Encounter (HOSPITAL_BASED_OUTPATIENT_CLINIC_OR_DEPARTMENT_OTHER): Payer: Self-pay | Admitting: Emergency Medicine

## 2018-10-12 DIAGNOSIS — R0789 Other chest pain: Secondary | ICD-10-CM | POA: Insufficient documentation

## 2018-10-12 DIAGNOSIS — M542 Cervicalgia: Secondary | ICD-10-CM | POA: Insufficient documentation

## 2018-10-12 DIAGNOSIS — R1010 Upper abdominal pain, unspecified: Secondary | ICD-10-CM | POA: Insufficient documentation

## 2018-10-12 DIAGNOSIS — Z79899 Other long term (current) drug therapy: Secondary | ICD-10-CM | POA: Diagnosis not present

## 2018-10-12 DIAGNOSIS — R079 Chest pain, unspecified: Secondary | ICD-10-CM | POA: Diagnosis present

## 2018-10-12 LAB — CBC WITH DIFFERENTIAL/PLATELET
Abs Immature Granulocytes: 0.01 10*3/uL (ref 0.00–0.07)
Basophils Absolute: 0 10*3/uL (ref 0.0–0.1)
Basophils Relative: 1 %
Eosinophils Absolute: 0.1 10*3/uL (ref 0.0–0.5)
Eosinophils Relative: 2 %
HCT: 39.3 % (ref 36.0–46.0)
Hemoglobin: 12 g/dL (ref 12.0–15.0)
Immature Granulocytes: 0 %
Lymphocytes Relative: 45 %
Lymphs Abs: 2 10*3/uL (ref 0.7–4.0)
MCH: 28 pg (ref 26.0–34.0)
MCHC: 30.5 g/dL (ref 30.0–36.0)
MCV: 91.8 fL (ref 80.0–100.0)
Monocytes Absolute: 0.3 10*3/uL (ref 0.1–1.0)
Monocytes Relative: 8 %
Neutro Abs: 1.9 10*3/uL (ref 1.7–7.7)
Neutrophils Relative %: 44 %
Platelets: 251 10*3/uL (ref 150–400)
RBC: 4.28 MIL/uL (ref 3.87–5.11)
RDW: 13 % (ref 11.5–15.5)
WBC: 4.4 10*3/uL (ref 4.0–10.5)
nRBC: 0 % (ref 0.0–0.2)

## 2018-10-12 LAB — TROPONIN I: Troponin I: <0.03 ng/mL (ref <0.03)

## 2018-10-12 LAB — COMPREHENSIVE METABOLIC PANEL
ALT: 13 U/L (ref 0–44)
AST: 14 U/L — ABNORMAL LOW (ref 15–41)
Albumin: 3.9 g/dL (ref 3.5–5.0)
Alkaline Phosphatase: 71 U/L (ref 38–126)
Anion gap: 10 (ref 5–15)
BUN: 19 mg/dL (ref 6–20)
CO2: 20 mmol/L — ABNORMAL LOW (ref 22–32)
Calcium: 8.8 mg/dL — ABNORMAL LOW (ref 8.9–10.3)
Chloride: 109 mmol/L (ref 98–111)
Creatinine, Ser: 0.55 mg/dL (ref 0.44–1.00)
GFR calc Af Amer: >60 mL/min (ref >60)
GFR calc non Af Amer: >60 mL/min (ref >60)
Glucose, Bld: 80 mg/dL (ref 70–99)
Potassium: 3.4 mmol/L — ABNORMAL LOW (ref 3.5–5.1)
Sodium: 139 mmol/L (ref 135–145)
Total Bilirubin: 0.5 mg/dL (ref 0.3–1.2)
Total Protein: 6.9 g/dL (ref 6.5–8.1)

## 2018-10-12 LAB — LIPASE, BLOOD: Lipase: 29 U/L (ref 11–51)

## 2018-10-12 NOTE — ED Notes (Signed)
ED Provider at bedside. 

## 2018-10-12 NOTE — Discharge Instructions (Addendum)
Today's work-up without any acute findings.  Chest x-ray negative ultrasound of the abdomen without any acute findings including no evidence of any gallstones.  Heart marker troponin negative basic labs including liver function test also without significant abnormalities.  Recommend you follow back up with your primary care doctor regarding some slightly elevated blood pressure readings.  They can trend this.  Also recommend following back up your primary care doctor about the neck pain.  They can decide whether they want to go ahead and proceed with MRI.

## 2018-10-12 NOTE — ED Triage Notes (Signed)
Pt repots chest pain, back, neck and arm pain x1 month. Reports nothing new about her presentation this morning. Reports she came to ED again because its been going on so long. Reports Ibuprofen and advil not effective.

## 2018-10-12 NOTE — ED Notes (Signed)
Pt verbalized understanding of dc instructions.

## 2018-10-12 NOTE — ED Provider Notes (Signed)
[facility] EMERGENCY DEPARTMENT Provider Note   CSN: [id number] Arrival date & time: 10/12/18  1308    History   Chief Complaint Chief Complaint  Patient presents with  . Chest Pain    HPI Brandi Gross is a 28 y.o. female.     Patient presenting with a complaint of chest pain.  And upper abdominal pain.  Some neck pain that radiates to the left arm sometimes and tingling in the left hand.  Also pain that radiates from the chest up into the anterior part of the neck.  Patient recently seen by primary care doctor on May 21.  They started her on Nexium.  And also did a battery of labs.  Her primary care doctors at [facility].  Patient also seen in the emergency department for this on April 20 and April 22.  Had a chest x-ray on April 20 without any acute findings.  Patient has an infant at home and is breast-feeding.  Patient states that the symptoms have been ongoing for a month.  There is also pain in the upper part of the lumbar back that radiates around to the upper abdomen area.  No nausea vomiting or diarrhea.  No fevers no upper respiratory symptoms.  No feeling of palpitations no leg swelling no shortness of breath.  No fever or upper respiratory symptoms.  In addition patient denies any weakness to the left upper extremity.     Past Medical History:  Diagnosis Date  . Medical history non-contributory     Patient Active Problem List   Diagnosis Date Noted  . Normal labor 07/30/2016  . NSVD (normal spontaneous vaginal delivery) 07/30/2016  . OBESITY 08/13/2007    Past Surgical History:  Procedure Laterality Date  . NO PAST SURGERIES       OB History    Gravida  1   Para  1   Term  1   Preterm      AB      Living  1     SAB      TAB      Ectopic      Multiple  0   Live Births  1            Home Medications    Prior to Admission medications   Medication Sig Start Date End Date Taking? Authorizing Provider  ibuprofen  (ADVIL,MOTRIN) 100 MG/5ML suspension Take 40 mLs (800 mg total) by mouth every 8 (eight) hours as needed. 08/01/16  Yes Bovard-Stuckert, Jody, MD  Prenatal Vit-Fe Fumarate-FA (PRENATAL MULTIVITAMIN) TABS tablet Take 1 tablet by mouth daily at 12 noon. 08/01/16   Bovard-StuckertAugusto Gamble, MD    Family History Family History  Problem Relation Age of Onset  . Cancer Maternal Aunt        breast  . Cancer Maternal Grandmother        breast    Social History Social History   Tobacco Use  . Smoking status: Never Smoker  . Smokeless tobacco: Never Used  Substance Use Topics  . Alcohol use: No  . Drug use: No     Allergies   Patient has no known allergies.   Review of Systems Review of Systems  Constitutional: Negative for chills and fever.  HENT: Negative for congestion, rhinorrhea and sore throat.   Eyes: Negative for visual disturbance.  Respiratory: Negative for cough and shortness of breath.   Cardiovascular: Positive for chest pain. Negative for palpitations and leg swelling.  Gastrointestinal: Positive for abdominal pain. Negative for diarrhea, nausea and vomiting.  Genitourinary: Negative for dysuria and hematuria.  Musculoskeletal: Negative for back pain and neck pain.  Skin: Negative for rash.  Neurological: Positive for numbness. Negative for dizziness, facial asymmetry, weakness, light-headedness and headaches.  Hematological: Does not bruise/bleed easily.  Psychiatric/Behavioral: Negative for confusion.     Physical Exam Updated Vital Signs BP (!) 153/103 (BP Location: Right Arm)   Pulse 85   Temp 98.5 F (36.9 C)   Resp 20   Ht 1.626 m (5\' 4" )   Wt 88.9 kg   SpO2 99%   BMI 33.64 kg/m   Physical Exam Vitals signs and nursing note reviewed.  Constitutional:      General: She is not in acute distress.    Appearance: She is well-developed.  HENT:     Head: Normocephalic and atraumatic.  Eyes:     Extraocular Movements: Extraocular movements intact.      Conjunctiva/sclera: Conjunctivae normal.     Pupils: Pupils are equal, round, and reactive to light.  Neck:     Musculoskeletal: Normal range of motion and neck supple. No neck rigidity or muscular tenderness.  Cardiovascular:     Rate and Rhythm: Normal rate and regular rhythm.     Heart sounds: Normal heart sounds. No murmur.  Pulmonary:     Effort: Pulmonary effort is normal. No respiratory distress.     Breath sounds: Normal breath sounds. No wheezing.  Abdominal:     General: Bowel sounds are normal.     Palpations: Abdomen is soft.     Tenderness: There is no abdominal tenderness.  Musculoskeletal: Normal range of motion.        General: No swelling or tenderness.     Comments: Pain with movement of the left arm.  Radial pulses 2+.  Good strength in the fingers.  Sensation intact.  Skin:    General: Skin is warm and dry.     Capillary Refill: Capillary refill takes less than 2 seconds.  Neurological:     General: No focal deficit present.     Mental Status: She is alert and oriented to person, place, and time.     Cranial Nerves: No cranial nerve deficit.     Sensory: No sensory deficit.     Motor: No weakness.      ED Treatments / Results  Labs (all labs ordered are listed, but only abnormal results are displayed) Labs Reviewed - No data to display  EKG EKG Interpretation  Date/Time:  Monday Oct 12 2018 07:03:44 EDT Ventricular Rate:  85 PR Interval:    QRS Duration: 91 QT Interval:  369 QTC Calculation: 439 R Axis:   37 Text Interpretation:  Sinus rhythm No significant change since last tracing Confirmed by Vanetta MuldersZackowski, Camella Seim [phone]([id number]) on 10/12/2018 7:13:52 AM   Radiology No results found.  Procedures Procedures (including critical care time)  Medications Ordered in ED Medications - No data to display   Initial Impression / Assessment and Plan / ED Course  I have reviewed the triage vital signs and the nursing notes.  Pertinent labs & imaging results  that were available during my care of the patient were reviewed by me and considered in my medical decision making (see chart for details).       Work-up here today expanded with some upper abdominal labs including liver function tests and lipase all negative.  Troponin also negative EKG without acute findings.  Chest x-ray without any  acute abnormalities.  Ultrasound of abdomen shows no evidence of any abnormalities in the abdomen in particular no evidence of any cholelithiasis.  Patient's blood pressure here is been slightly elevated.  This could be due to a little bit of worry.  We will have patient follow back up with primary care doctor for trending of her blood pressure.  Also recommend following back up with primary care doctor to see if they want to do additional testing.  One consideration would be MRI of the neck.  Patient nontoxic no acute distress.  Clinically no concerns for pulmonary embolus.  Symptoms have been present for a month.  Not tachycardic not hypoxic never had any shortness of breath with it.  Chest pain itself seems to be noncardiac in nature I think it is atypical.  As stated earlier patient is currently breast-feeding.  This will limit some of the medications that she can take.   Final Clinical Impressions(s) / ED Diagnoses   Final diagnoses:  None    ED Discharge Orders    None       Vanetta Mulders, MD 10/12/18 1041

## 2019-11-20 IMAGING — US ULTRASOUND ABDOMEN COMPLETE
1 series · 13 of 25 positions shown · non-contrast
Comparison: None.

CLINICAL DATA: Upper abdominal pain

EXAM:
ABDOMEN ULTRASOUND COMPLETE

[Series 1: ultrasound abdomen complete · 13 of 139 slices shown]
[im 1/139]
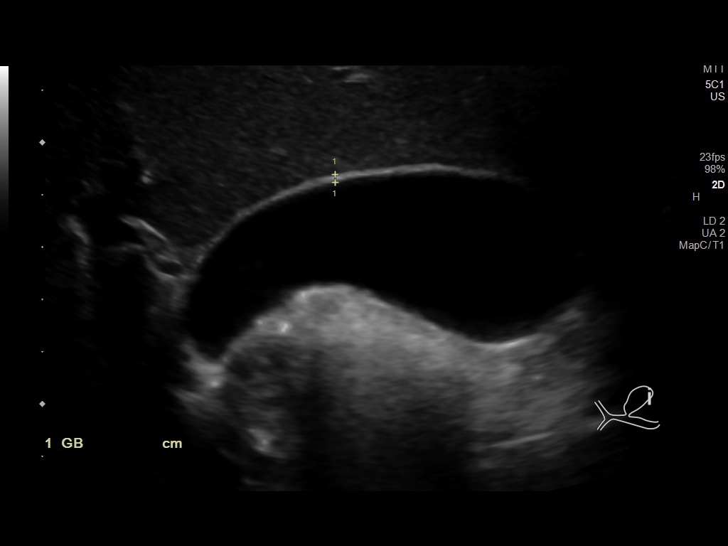
[im 12/139]
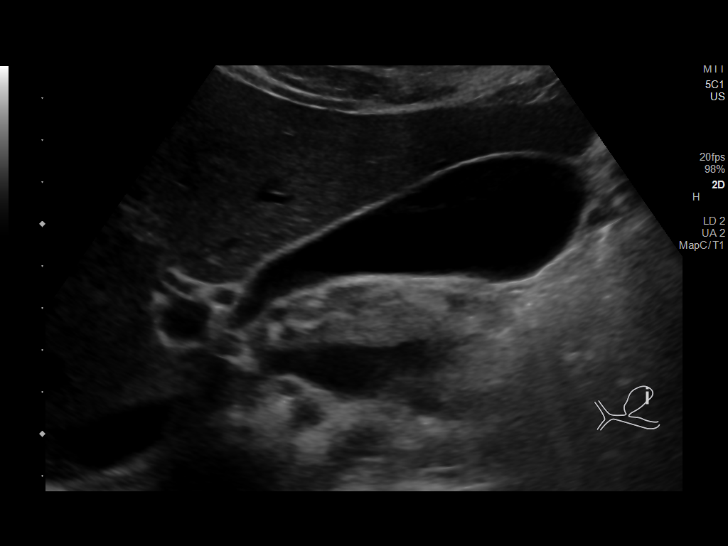
[im 24/139]
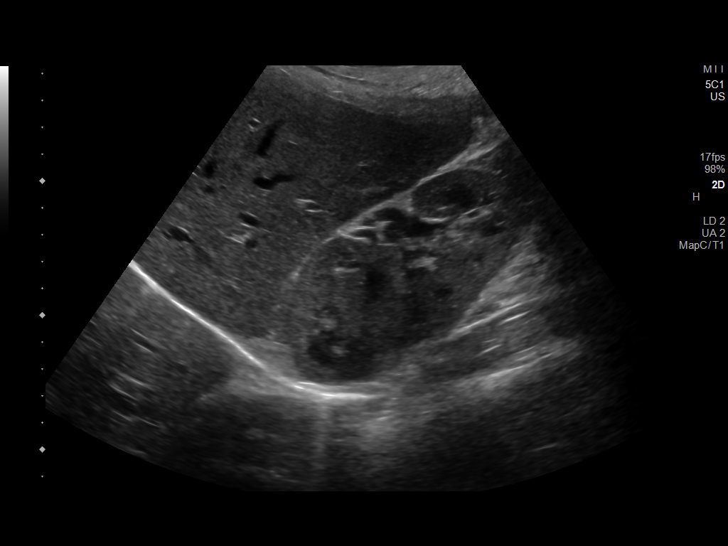
[im 35/139]
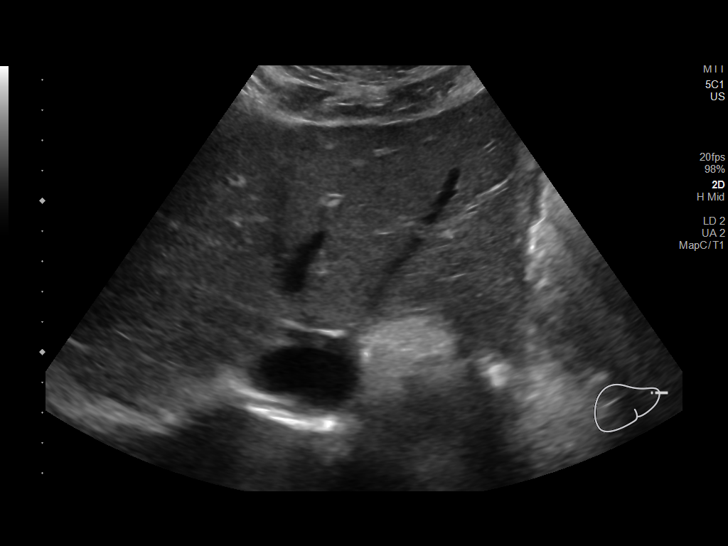
[im 47/139]
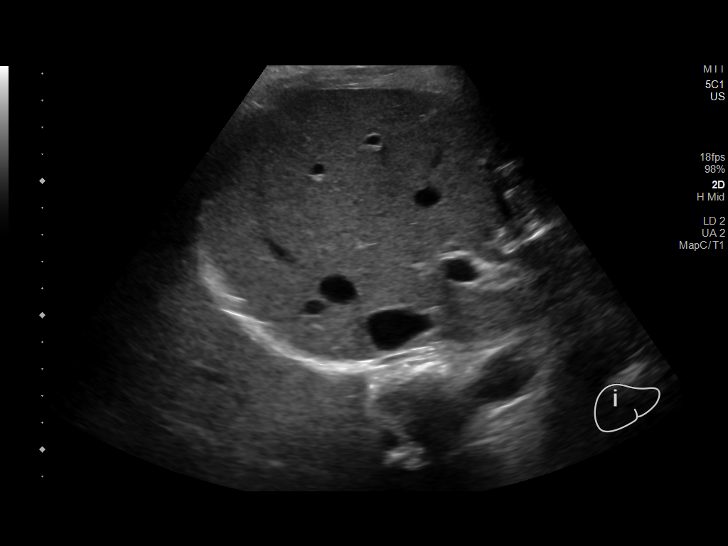
[im 58/139]
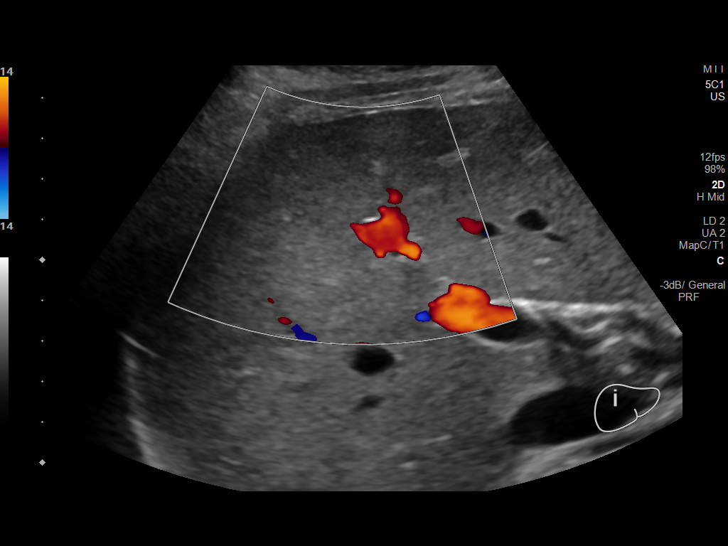
[im 70/139]
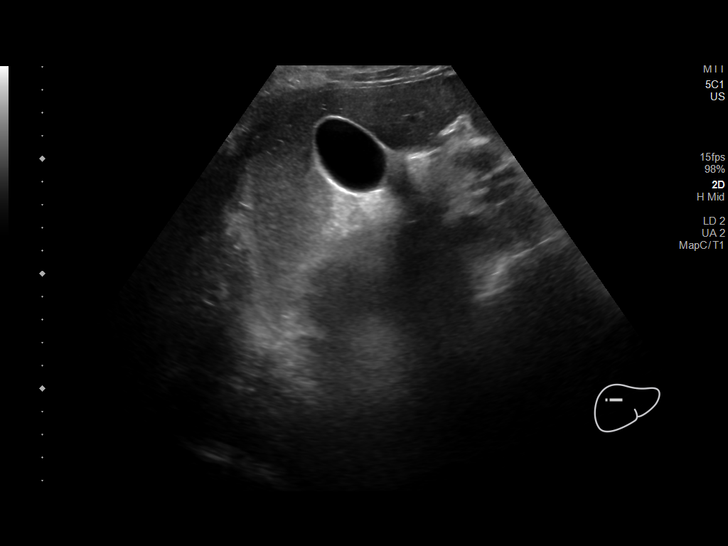
[im 81/139]
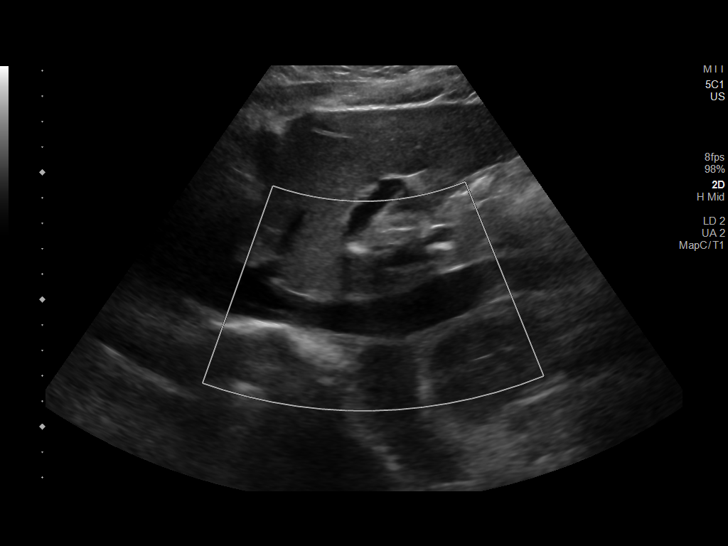
[im 93/139]
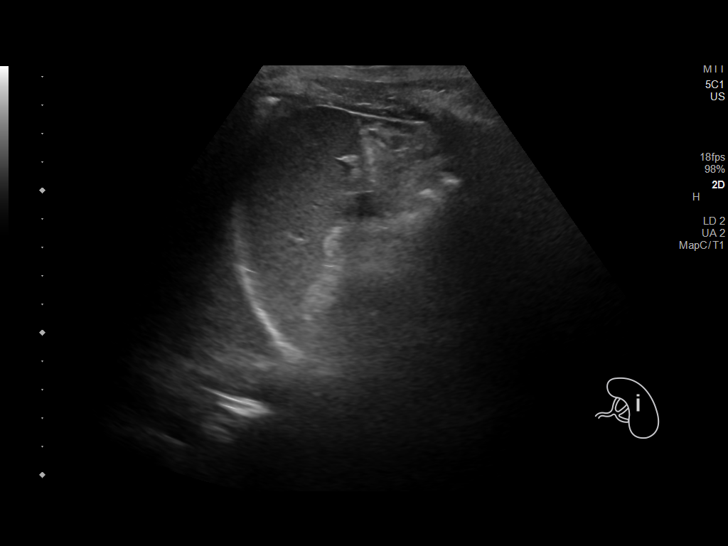
[im 104/139]
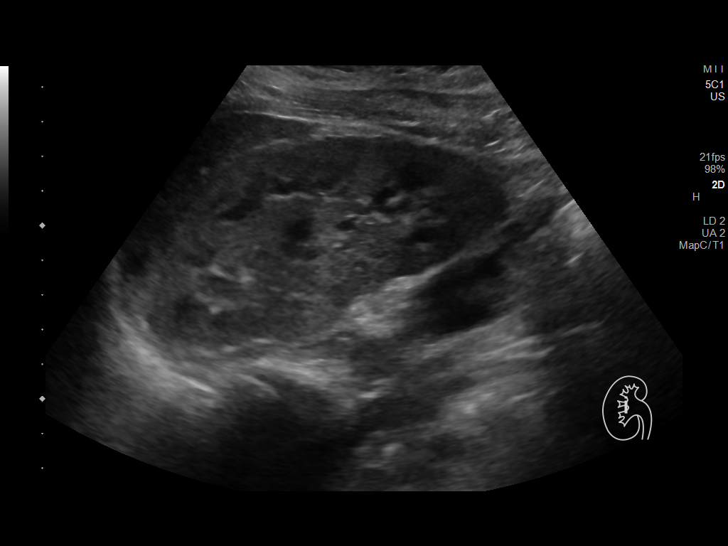
[im 116/139]
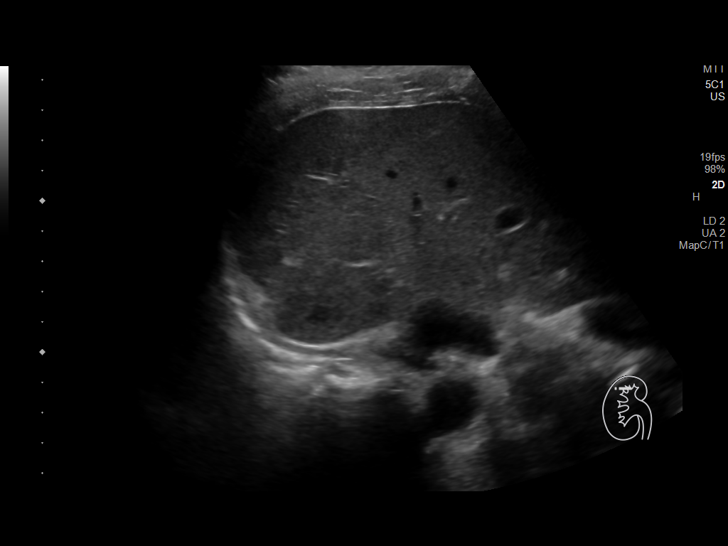
[im 127/139]
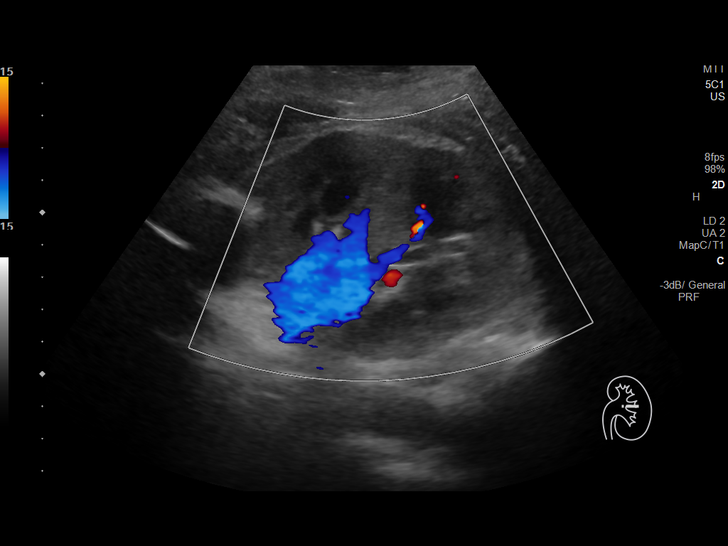
[im 139/139]
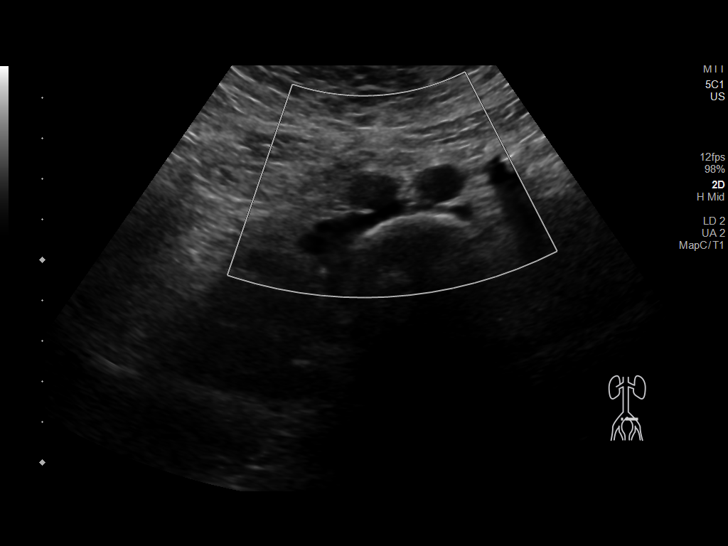

[13 of 25 positions shown; findings below may reference images not displayed]

FINDINGS: Gallbladder: No gallstones or wall thickening visualized. No
sonographic Murphy sign noted by sonographer.

Common bile duct: Diameter: 6.4 minutes mm

Liver: 1.8 x 1.6 x 2 cm hyperechoic right hepatic mass. At least 3
other other smaller subcentimeter hyperechoic right hepatic masses.
Liver parenchyma is normal in echogenicity. Portal vein is patent on
color Doppler imaging with normal direction of blood flow towards
the liver.

IVC: No abnormality visualized.

Pancreas: Visualized portion unremarkable.

Spleen: Size and appearance within normal limits.

Right Kidney: Length: 11.7 cm. Echogenicity within normal limits. No
mass or hydronephrosis visualized.

Left Kidney: Length: 11.1 cm. Echogenicity within normal limits. No
mass or hydronephrosis visualized.

Abdominal aorta: No aneurysm visualized.

Other findings: None.
IMPRESSION: 1. No acute abdominal abnormality.
2. No cholelithiasis or sonographic evidence of acute cholecystitis.
3. Multiple hyperechoic right hepatic masses with the largest
measuring 1.8 x 1.6 x 2 cm statistically likely reflect small
hemangiomas. These can be further characterized by a non emergent
MRI of the liver.

## 2019-11-20 IMAGING — CR CHEST - 2 VIEW
2 series · 2 of 2 positions shown · non-contrast
Comparison: Chest x-ray dated 09/07/2018

CLINICAL DATA: Chest pain for a month.

EXAM:
CHEST - 2 VIEW

[w chest pa]
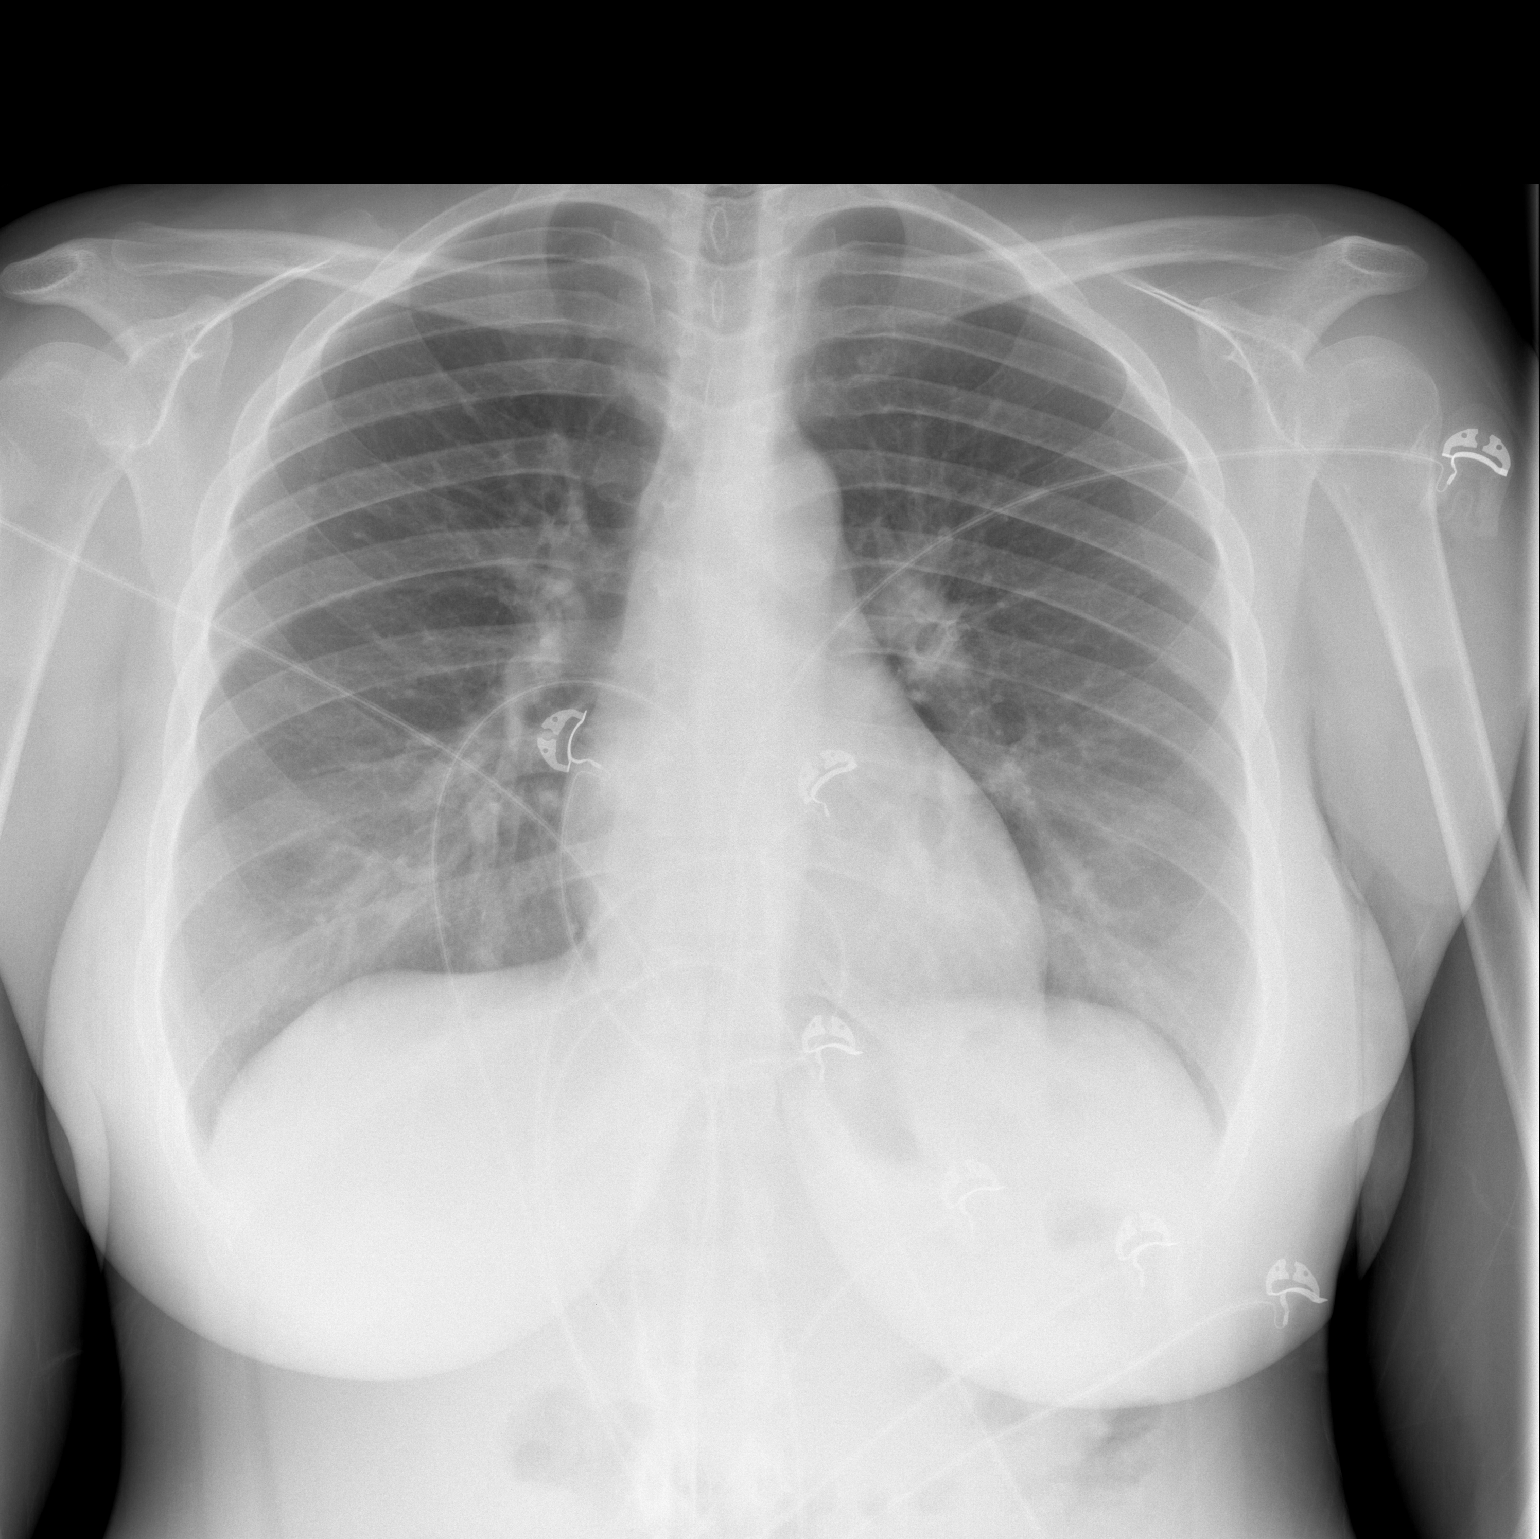

[w chest lat]
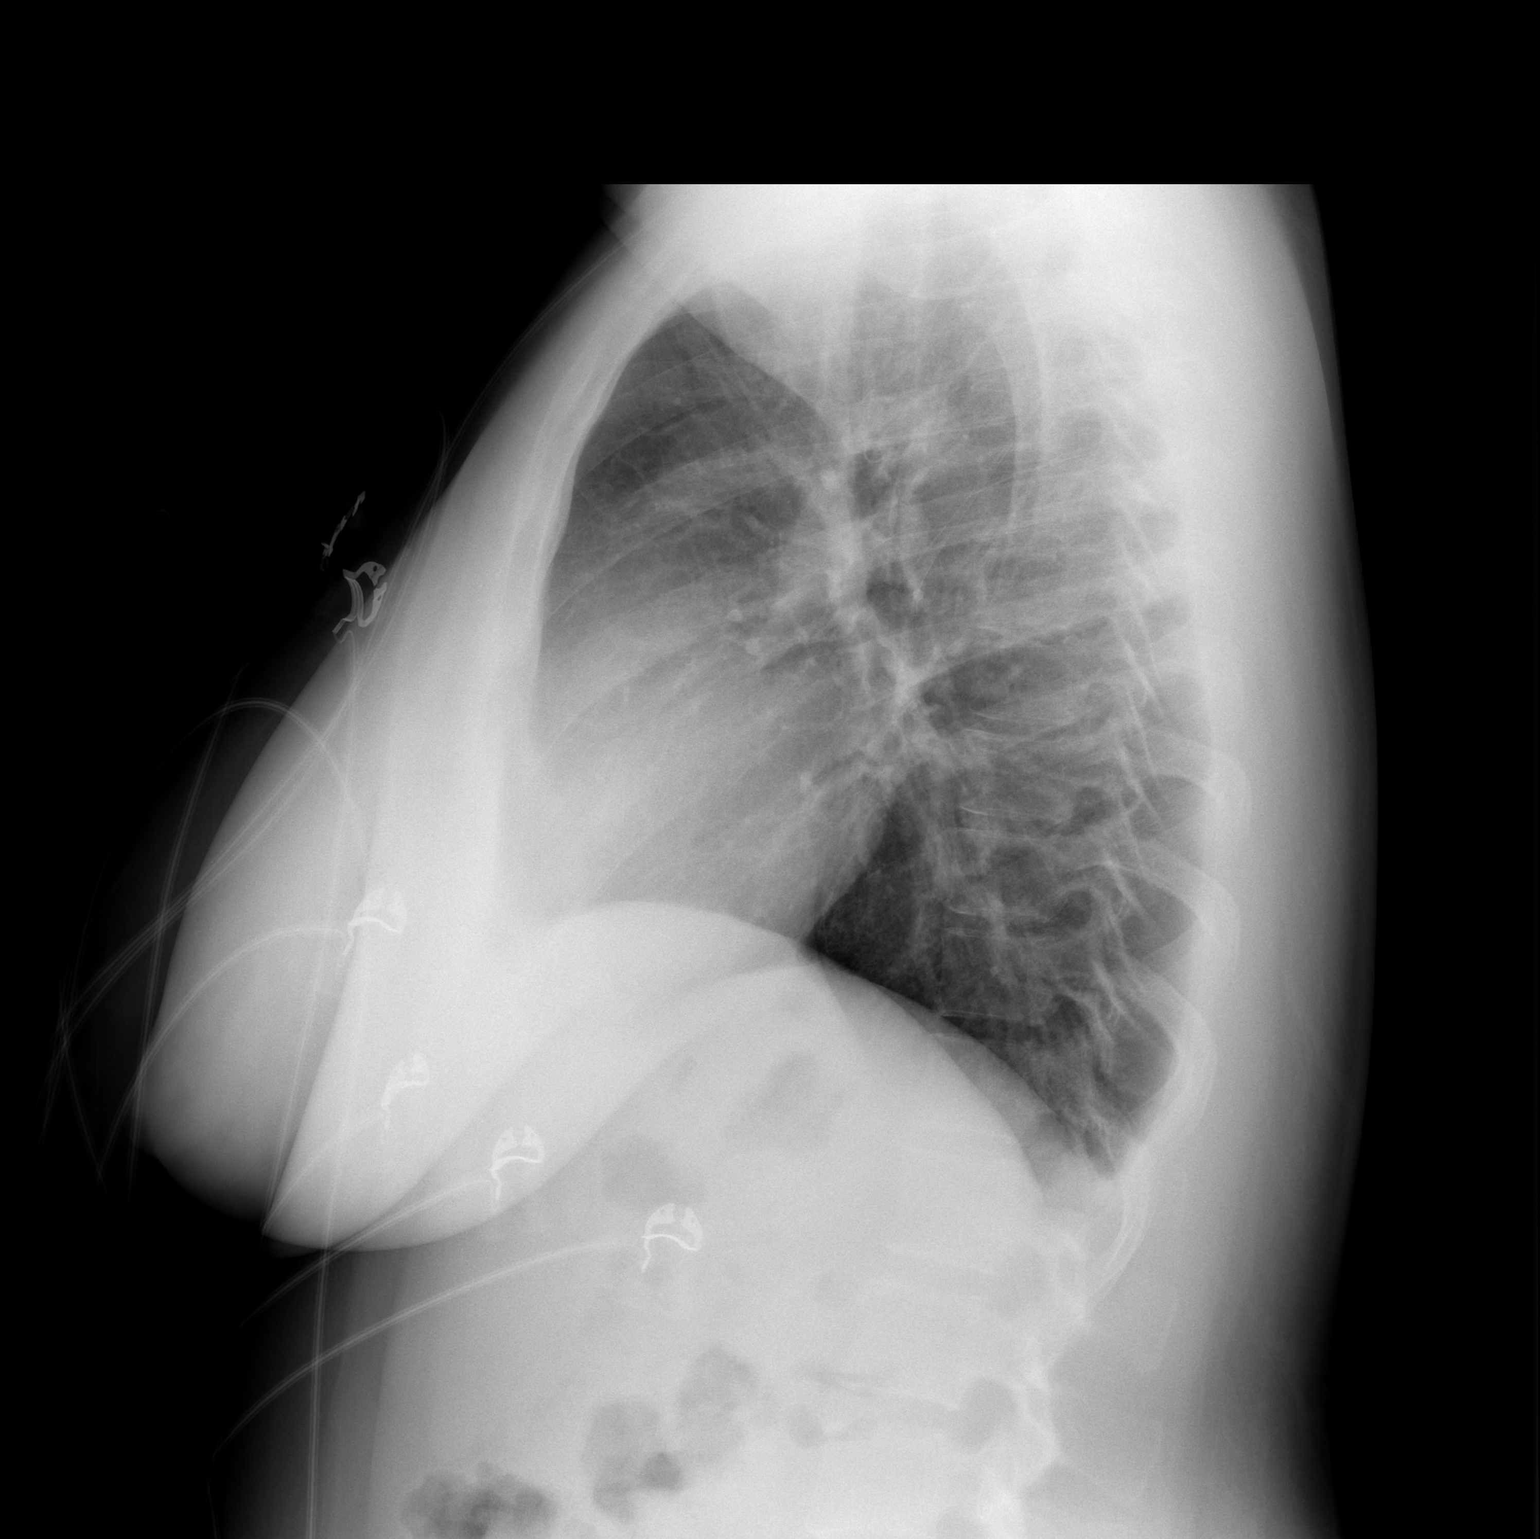

[2 of 2 positions shown; findings below may reference images not displayed]

FINDINGS: Cardiomediastinal silhouette is within normal limits in size and
configuration. Lungs are clear. Lung volumes are normal. No evidence
of pneumonia. No pleural effusion. No pneumothorax seen. Osseous
structures about the chest are unremarkable.
IMPRESSION: No active cardiopulmonary disease. No evidence of pneumonia or
pulmonary edema.

## 2021-05-20 NOTE — L&D Delivery Note (Signed)
Delivery Note At 5:57 PM a viable and healthy female was delivered via Vaginal, Spontaneous (Presentation:   vtx/ Occiput Anterior).  APGAR: 9, 9; weight  pending.   Placenta status: Spontaneous, Intact. To path due to IUGR Cord: 3 vessels with the following complications: None.  Cord pH: n/a  Anesthesia: Epidural Episiotomy: None Lacerations: None Suture Repair:  n/a Est. Blood Loss (mL): 100  Mom to postpartum.  Baby to Couplet care / Skin to Skin.  [name] A [name] 01/17/2022, 6:27 PM

## 2021-07-10 LAB — HEPATITIS C ANTIBODY: HCV Ab: NEGATIVE

## 2021-07-10 LAB — OB RESULTS CONSOLE RPR: RPR: NONREACTIVE

## 2021-07-10 LAB — OB RESULTS CONSOLE HIV ANTIBODY (ROUTINE TESTING): HIV: NONREACTIVE

## 2021-07-10 LAB — OB RESULTS CONSOLE HEPATITIS B SURFACE ANTIGEN: Hepatitis B Surface Ag: NEGATIVE

## 2021-07-10 LAB — OB RESULTS CONSOLE RUBELLA ANTIBODY, IGM: Rubella: IMMUNE

## 2021-07-11 LAB — OB RESULTS CONSOLE GC/CHLAMYDIA
Chlamydia: NEGATIVE
Neisseria Gonorrhea: NEGATIVE

## 2021-11-27 ENCOUNTER — Other Ambulatory Visit: Payer: Self-pay | Admitting: Obstetrics and Gynecology

## 2021-11-27 DIAGNOSIS — Z3A30 30 weeks gestation of pregnancy: Secondary | ICD-10-CM

## 2021-11-27 DIAGNOSIS — Z363 Encounter for antenatal screening for malformations: Secondary | ICD-10-CM

## 2021-11-27 DIAGNOSIS — O365931 Maternal care for other known or suspected poor fetal growth, third trimester, fetus 1: Secondary | ICD-10-CM

## 2021-11-28 ENCOUNTER — Ambulatory Visit (HOSPITAL_BASED_OUTPATIENT_CLINIC_OR_DEPARTMENT_OTHER): Payer: PRIVATE HEALTH INSURANCE | Admitting: *Deleted

## 2021-11-28 ENCOUNTER — Other Ambulatory Visit: Payer: Self-pay | Admitting: Obstetrics and Gynecology

## 2021-11-28 ENCOUNTER — Ambulatory Visit: Payer: PRIVATE HEALTH INSURANCE | Attending: Obstetrics and Gynecology

## 2021-11-28 ENCOUNTER — Ambulatory Visit: Payer: PRIVATE HEALTH INSURANCE | Admitting: *Deleted

## 2021-11-28 ENCOUNTER — Other Ambulatory Visit: Payer: Self-pay | Admitting: *Deleted

## 2021-11-28 ENCOUNTER — Ambulatory Visit (HOSPITAL_BASED_OUTPATIENT_CLINIC_OR_DEPARTMENT_OTHER): Payer: PRIVATE HEALTH INSURANCE | Admitting: Obstetrics and Gynecology

## 2021-11-28 VITALS — BP 119/68 | HR 95

## 2021-11-28 DIAGNOSIS — O365930 Maternal care for other known or suspected poor fetal growth, third trimester, not applicable or unspecified: Secondary | ICD-10-CM

## 2021-11-28 DIAGNOSIS — Z363 Encounter for antenatal screening for malformations: Secondary | ICD-10-CM

## 2021-11-28 DIAGNOSIS — Z3A30 30 weeks gestation of pregnancy: Secondary | ICD-10-CM

## 2021-11-28 DIAGNOSIS — Z8759 Personal history of other complications of pregnancy, childbirth and the puerperium: Secondary | ICD-10-CM

## 2021-11-28 DIAGNOSIS — O365931 Maternal care for other known or suspected poor fetal growth, third trimester, fetus 1: Secondary | ICD-10-CM | POA: Diagnosis not present

## 2021-11-28 NOTE — Progress Notes (Signed)
Maternal-Fetal Medicine   Name: Brandi Gross DOB: [birth year] MRN: [id number] Referring Provider: Maxie Better, MD  I had the pleasure of seeing [facility] at [facility]. She is G3 P2002 at 30w 6d gestation and is here for ultrasound evaluation of her pregnancy. At your office ultrasound fetal growth restriction was suspected.  Her prenatal course has been otherwise uneventful.  On cell-free fetal DNA screening, the risks of fetal aneuploidies are not increased.  Patient reports she does not have gestational diabetes.  Obstetric history -07/2016: Term vaginal delivery of a female infant weighing 5 pounds and 10 ounces at birth.  Her son is in good health.  Her pregnancy was not complicated by fetal growth restriction. -07/2018: Term vaginal delivery of a female infant weighing 5 pounds and 5 ounces at birth.  Her pregnancy was not complicated with fetal growth restriction.  Her daughter is in good health.  Patient developed postpartum hypertension.  GYN history: No history of abnormal Pap smears or cervical surgeries.  No history of breast disease. Past medical history: No history of hypertension or diabetes or any chronic medical conditions. Past surgical history: Nil of note. Medications: Low-dose aspirin, prenatal vitamins, iron. Allergies: No known drug allergies. Social history: Denies tobacco or drug or alcohol use.  Her partner is African-American and he is in good health. Family history: No history of venous thromboembolism in the family.   Ultrasound On today's ultrasound, the estimated fetal weight is at the 3rd percentile and the abdominal circumference measurement is at the 5th percentile.  Head circumference measurement is at between -2 and -1 SD (normal).  Amniotic fluid is normal and good fetal activity seen.  Umbilical artery Doppler showed increased S/D ratio.  NST is reactive.  Fetal growth restriction I explained the finding of fetal  growth restriction that is difficult to differentiate from a constitutionally small for gestational age fetus. I discussed the possible causes of fetal growth restriction including placental insufficiency (most common cause), chromosomal anomalies and fetal infections.  Patient did not give history of fever or rashes. I explained that only amniocentesis will give a definitive result on the fetal karyotype and some genetic conditions (Microarray).  I explained amniocentesis procedure and possible complication of preterm delivery (1 and 500 procedures). Patient opted not to have amniocentesis. I discussed ultrasound protocol of monitoring fetal growth restriction. Timing of delivery: Since small for gestational age fetuses have a higher chance of having perinatal mortality and morbidity, delivery at 49 to 105 weeks gestation is reasonable.  If, however, severe fetal growth restriction is seen with normal antenatal testing, we will recommend delivery at [redacted] weeks gestation.  Recommendations -Appointments were made for weekly BPP, NST and UA Doppler studies.   Thank you for consultation.  If you have any questions or concerns, please contact me the [facility].  Consultation including face-to-face (more than 50%) counseling 30 minutes.

## 2021-11-28 NOTE — Procedures (Signed)
Brandi Gross 1990-08-03 [redacted]w[redacted]d  Fetus A Non-Stress Test Interpretation for 11/28/21  Indication: IUGR  Fetal Heart Rate A Mode: External Baseline Rate (A): 135 bpm Variability: Moderate Accelerations: 15 x 15 Decelerations: None Multiple birth?: No  Uterine Activity Mode: Palpation, Toco Contraction Frequency (min): none Resting Tone Palpated: Relaxed  Interpretation (Fetal Testing) Nonstress Test Interpretation: Reactive Overall Impression: Reassuring for gestational age Comments: Dr. Judeth Cornfield reviewed tracing

## 2021-12-05 ENCOUNTER — Ambulatory Visit (HOSPITAL_BASED_OUTPATIENT_CLINIC_OR_DEPARTMENT_OTHER): Payer: 59 | Admitting: *Deleted

## 2021-12-05 ENCOUNTER — Ambulatory Visit: Payer: 59 | Attending: Obstetrics and Gynecology

## 2021-12-05 ENCOUNTER — Ambulatory Visit: Payer: 59 | Admitting: *Deleted

## 2021-12-05 VITALS — BP 111/65 | HR 101

## 2021-12-05 DIAGNOSIS — Z8759 Personal history of other complications of pregnancy, childbirth and the puerperium: Secondary | ICD-10-CM | POA: Diagnosis present

## 2021-12-05 DIAGNOSIS — O365931 Maternal care for other known or suspected poor fetal growth, third trimester, fetus 1: Secondary | ICD-10-CM

## 2021-12-05 DIAGNOSIS — O365930 Maternal care for other known or suspected poor fetal growth, third trimester, not applicable or unspecified: Secondary | ICD-10-CM | POA: Insufficient documentation

## 2021-12-05 DIAGNOSIS — O09293 Supervision of pregnancy with other poor reproductive or obstetric history, third trimester: Secondary | ICD-10-CM | POA: Diagnosis not present

## 2021-12-05 DIAGNOSIS — Z3A31 31 weeks gestation of pregnancy: Secondary | ICD-10-CM

## 2021-12-05 NOTE — Procedures (Signed)
Brandi Gross 1990-08-02 [redacted]w[redacted]d  Fetus A Non-Stress Test Interpretation for 12/05/21  Indication: IUGR  Fetal Heart Rate A Mode: External Baseline Rate (A): 125 bpm Variability: Moderate Accelerations: 15 x 15 Decelerations: None Multiple birth?: No  Uterine Activity Mode: Palpation, Toco Contraction Frequency (min): none Resting Tone Palpated: Relaxed  Interpretation (Fetal Testing) Nonstress Test Interpretation: Reactive Overall Impression: Reassuring for gestational age Comments: Dr. Grace Bushy reviewed tracing

## 2021-12-11 ENCOUNTER — Ambulatory Visit: Payer: 59 | Admitting: *Deleted

## 2021-12-11 ENCOUNTER — Ambulatory Visit: Payer: 59 | Attending: Obstetrics and Gynecology

## 2021-12-11 ENCOUNTER — Encounter: Payer: Self-pay | Admitting: *Deleted

## 2021-12-11 DIAGNOSIS — Z3A32 32 weeks gestation of pregnancy: Secondary | ICD-10-CM

## 2021-12-11 DIAGNOSIS — O365931 Maternal care for other known or suspected poor fetal growth, third trimester, fetus 1: Secondary | ICD-10-CM | POA: Diagnosis present

## 2021-12-11 DIAGNOSIS — O09293 Supervision of pregnancy with other poor reproductive or obstetric history, third trimester: Secondary | ICD-10-CM | POA: Diagnosis not present

## 2021-12-11 DIAGNOSIS — O365930 Maternal care for other known or suspected poor fetal growth, third trimester, not applicable or unspecified: Secondary | ICD-10-CM

## 2021-12-11 DIAGNOSIS — Z8759 Personal history of other complications of pregnancy, childbirth and the puerperium: Secondary | ICD-10-CM | POA: Insufficient documentation

## 2021-12-11 NOTE — Procedures (Signed)
Brandi Gross February 15, 1991 [redacted]w[redacted]d  Fetus A Non-Stress Test Interpretation for 12/11/21  Indication: IUGR  Fetal Heart Rate A Baseline Rate (A): 140 bpm Variability: Moderate Accelerations: 15 x 15 Decelerations: None Multiple birth?: No  Uterine Activity Mode: Palpation, Toco Contraction Frequency (min): Erratic UI Contraction Quality: Mild  Interpretation (Fetal Testing) Nonstress Test Interpretation: Reactive Comments: Dr. Judeth Cornfield reviewed tracing.

## 2021-12-17 ENCOUNTER — Ambulatory Visit: Payer: 59 | Admitting: *Deleted

## 2021-12-17 ENCOUNTER — Encounter: Payer: Self-pay | Admitting: *Deleted

## 2021-12-17 ENCOUNTER — Other Ambulatory Visit: Payer: Self-pay | Admitting: *Deleted

## 2021-12-17 ENCOUNTER — Ambulatory Visit: Payer: 59 | Attending: Obstetrics and Gynecology

## 2021-12-17 VITALS — BP 109/58 | HR 80

## 2021-12-17 DIAGNOSIS — O365931 Maternal care for other known or suspected poor fetal growth, third trimester, fetus 1: Secondary | ICD-10-CM | POA: Diagnosis not present

## 2021-12-17 DIAGNOSIS — O365930 Maternal care for other known or suspected poor fetal growth, third trimester, not applicable or unspecified: Secondary | ICD-10-CM | POA: Diagnosis not present

## 2021-12-17 DIAGNOSIS — O09293 Supervision of pregnancy with other poor reproductive or obstetric history, third trimester: Secondary | ICD-10-CM

## 2021-12-17 DIAGNOSIS — Z8759 Personal history of other complications of pregnancy, childbirth and the puerperium: Secondary | ICD-10-CM

## 2021-12-17 DIAGNOSIS — Z3A33 33 weeks gestation of pregnancy: Secondary | ICD-10-CM | POA: Diagnosis not present

## 2021-12-17 DIAGNOSIS — O365990 Maternal care for other known or suspected poor fetal growth, unspecified trimester, not applicable or unspecified: Secondary | ICD-10-CM

## 2021-12-17 NOTE — Procedures (Signed)
Brandi Gross 1990-11-05 [redacted]w[redacted]d  Fetus A Non-Stress Test Interpretation for 12/17/21  Indication: IUGR  Fetal Heart Rate A Mode: External Baseline Rate (A): 140 bpm Variability: Moderate Accelerations: 15 x 15 Decelerations: None Multiple birth?: No  Uterine Activity Mode: Palpation, Toco Contraction Frequency (min): 1 UC w/UI Contraction Quality: Mild Resting Tone Palpated: Relaxed Resting Time: Adequate  Interpretation (Fetal Testing) Nonstress Test Interpretation: Reactive Comments: Dr. Parke Poisson reviewed tracing.

## 2021-12-18 ENCOUNTER — Ambulatory Visit: Payer: 59

## 2021-12-25 ENCOUNTER — Ambulatory Visit: Payer: 59 | Attending: Obstetrics and Gynecology

## 2021-12-25 ENCOUNTER — Ambulatory Visit: Payer: 59 | Admitting: *Deleted

## 2021-12-25 ENCOUNTER — Ambulatory Visit (HOSPITAL_BASED_OUTPATIENT_CLINIC_OR_DEPARTMENT_OTHER): Payer: 59 | Admitting: *Deleted

## 2021-12-25 VITALS — BP 112/65 | HR 87

## 2021-12-25 DIAGNOSIS — O365930 Maternal care for other known or suspected poor fetal growth, third trimester, not applicable or unspecified: Secondary | ICD-10-CM

## 2021-12-25 DIAGNOSIS — Z8759 Personal history of other complications of pregnancy, childbirth and the puerperium: Secondary | ICD-10-CM | POA: Diagnosis present

## 2021-12-25 DIAGNOSIS — O09293 Supervision of pregnancy with other poor reproductive or obstetric history, third trimester: Secondary | ICD-10-CM | POA: Diagnosis not present

## 2021-12-25 DIAGNOSIS — Z3A34 34 weeks gestation of pregnancy: Secondary | ICD-10-CM | POA: Diagnosis not present

## 2021-12-25 DIAGNOSIS — O365931 Maternal care for other known or suspected poor fetal growth, third trimester, fetus 1: Secondary | ICD-10-CM | POA: Insufficient documentation

## 2021-12-25 NOTE — Procedures (Signed)
Brandi Gross 11/15/1990 [redacted]w[redacted]d  Fetus A Non-Stress Test Interpretation for 12/25/21  Indication: IUGR  Fetal Heart Rate A Mode: External Baseline Rate (A): 135 bpm Variability: Moderate Accelerations: 15 x 15 Decelerations: Variable Multiple birth?: No  Uterine Activity Mode: Palpation, Toco Contraction Frequency (min): Occas w/UI Contraction Quality: Mild Resting Tone Palpated: Relaxed Resting Time: Adequate  Interpretation (Fetal Testing) Nonstress Test Interpretation: Reactive Comments: Dr. Grace Bushy reviewed tracing.

## 2021-12-31 ENCOUNTER — Ambulatory Visit: Payer: 59 | Attending: Obstetrics

## 2021-12-31 ENCOUNTER — Encounter: Payer: Self-pay | Admitting: *Deleted

## 2021-12-31 ENCOUNTER — Ambulatory Visit: Payer: 59 | Admitting: *Deleted

## 2021-12-31 VITALS — BP 109/64 | HR 94

## 2021-12-31 DIAGNOSIS — O09293 Supervision of pregnancy with other poor reproductive or obstetric history, third trimester: Secondary | ICD-10-CM

## 2021-12-31 DIAGNOSIS — O365930 Maternal care for other known or suspected poor fetal growth, third trimester, not applicable or unspecified: Secondary | ICD-10-CM

## 2021-12-31 DIAGNOSIS — Z8759 Personal history of other complications of pregnancy, childbirth and the puerperium: Secondary | ICD-10-CM | POA: Insufficient documentation

## 2021-12-31 DIAGNOSIS — O365990 Maternal care for other known or suspected poor fetal growth, unspecified trimester, not applicable or unspecified: Secondary | ICD-10-CM | POA: Insufficient documentation

## 2021-12-31 DIAGNOSIS — Z3A35 35 weeks gestation of pregnancy: Secondary | ICD-10-CM | POA: Diagnosis not present

## 2021-12-31 NOTE — Procedures (Signed)
Brandi Gross 1991/03/28 [redacted]w[redacted]d  Fetus A Non-Stress Test Interpretation for 12/31/21  Indication: IUGR  Fetal Heart Rate A Mode: External Baseline Rate (A): 145 bpm Variability: Moderate Accelerations: 15 x 15 Decelerations: None Multiple birth?: No  Uterine Activity Mode: Palpation, Toco Contraction Frequency (min): UI Contraction Quality: Mild Resting Tone Palpated: Relaxed Resting Time: Adequate  Interpretation (Fetal Testing) Nonstress Test Interpretation: Reactive Comments: Dr. Parke Poisson reviewed tracing.

## 2022-01-03 LAB — OB RESULTS CONSOLE GBS: GBS: POSITIVE

## 2022-01-07 ENCOUNTER — Ambulatory Visit: Payer: 59 | Admitting: *Deleted

## 2022-01-07 ENCOUNTER — Encounter: Payer: Self-pay | Admitting: *Deleted

## 2022-01-07 ENCOUNTER — Ambulatory Visit: Payer: 59 | Attending: Obstetrics

## 2022-01-07 DIAGNOSIS — Z3A36 36 weeks gestation of pregnancy: Secondary | ICD-10-CM | POA: Diagnosis not present

## 2022-01-07 DIAGNOSIS — Z8759 Personal history of other complications of pregnancy, childbirth and the puerperium: Secondary | ICD-10-CM | POA: Diagnosis present

## 2022-01-07 DIAGNOSIS — O09293 Supervision of pregnancy with other poor reproductive or obstetric history, third trimester: Secondary | ICD-10-CM | POA: Diagnosis not present

## 2022-01-07 DIAGNOSIS — O365990 Maternal care for other known or suspected poor fetal growth, unspecified trimester, not applicable or unspecified: Secondary | ICD-10-CM | POA: Diagnosis present

## 2022-01-07 DIAGNOSIS — O365930 Maternal care for other known or suspected poor fetal growth, third trimester, not applicable or unspecified: Secondary | ICD-10-CM | POA: Diagnosis present

## 2022-01-07 NOTE — Procedures (Signed)
Brandi Gross Jul 27, 1990 [redacted]w[redacted]d  Fetus A Non-Stress Test Interpretation for 01/07/22  Indication: IUGR  Fetal Heart Rate A Mode: External Baseline Rate (A): 140 bpm Variability: Moderate Accelerations: 15 x 15 Decelerations: None Multiple birth?: No  Uterine Activity Mode: Palpation, Toco Contraction Frequency (min): 1 uc Contraction Duration (sec): 90 Contraction Quality: Mild Resting Tone Palpated: Relaxed Resting Time: Adequate  Interpretation (Fetal Testing) Nonstress Test Interpretation: Reactive Overall Impression: Reassuring for gestational age Comments: Dr. Parke Poisson reviewed tracing

## 2022-01-14 ENCOUNTER — Ambulatory Visit: Payer: 59 | Admitting: *Deleted

## 2022-01-14 ENCOUNTER — Ambulatory Visit (HOSPITAL_BASED_OUTPATIENT_CLINIC_OR_DEPARTMENT_OTHER): Payer: 59

## 2022-01-14 VITALS — BP 110/66 | HR 96

## 2022-01-14 DIAGNOSIS — Z8759 Personal history of other complications of pregnancy, childbirth and the puerperium: Secondary | ICD-10-CM | POA: Insufficient documentation

## 2022-01-14 DIAGNOSIS — O365930 Maternal care for other known or suspected poor fetal growth, third trimester, not applicable or unspecified: Secondary | ICD-10-CM | POA: Insufficient documentation

## 2022-01-14 DIAGNOSIS — O365990 Maternal care for other known or suspected poor fetal growth, unspecified trimester, not applicable or unspecified: Secondary | ICD-10-CM | POA: Insufficient documentation

## 2022-01-14 DIAGNOSIS — O09293 Supervision of pregnancy with other poor reproductive or obstetric history, third trimester: Secondary | ICD-10-CM

## 2022-01-14 DIAGNOSIS — Z3A37 37 weeks gestation of pregnancy: Secondary | ICD-10-CM

## 2022-01-14 NOTE — Procedures (Signed)
Brandi Gross 25-Dec-1990 [redacted]w[redacted]d  Fetus A Non-Stress Test Interpretation for 01/14/22  Indication: IUGR  Fetal Heart Rate A Mode: External Baseline Rate (A): 145 bpm Variability: Moderate Accelerations: 15 x 15 Decelerations: None  Uterine Activity Mode: Palpation, Toco Contraction Frequency (min): 1 UC w/UI Contraction Quality: Mild Resting Tone Palpated: Relaxed Resting Time: Adequate  Interpretation (Fetal Testing) Nonstress Test Interpretation: Reactive Comments: Dr. Judeth Cornfield reviewed tracing.

## 2022-01-15 ENCOUNTER — Telehealth (HOSPITAL_COMMUNITY): Payer: Self-pay | Admitting: *Deleted

## 2022-01-15 NOTE — Telephone Encounter (Unsigned)
Preadmission screen  

## 2022-01-17 ENCOUNTER — Inpatient Hospital Stay (HOSPITAL_COMMUNITY): Payer: 59

## 2022-01-17 ENCOUNTER — Encounter (HOSPITAL_COMMUNITY): Payer: Self-pay | Admitting: Obstetrics and Gynecology

## 2022-01-17 ENCOUNTER — Other Ambulatory Visit: Payer: Self-pay

## 2022-01-17 ENCOUNTER — Inpatient Hospital Stay (HOSPITAL_COMMUNITY): Payer: 59 | Admitting: Anesthesiology

## 2022-01-17 ENCOUNTER — Inpatient Hospital Stay (HOSPITAL_COMMUNITY)
Admission: AD | Admit: 2022-01-17 | Discharge: 2022-01-18 | DRG: 807 | Disposition: A | Discharge status: Home / Self Care | Payer: 59 | Attending: Obstetrics and Gynecology | Admitting: Obstetrics and Gynecology

## 2022-01-17 DIAGNOSIS — Z3A38 38 weeks gestation of pregnancy: Secondary | ICD-10-CM

## 2022-01-17 DIAGNOSIS — O99824 Streptococcus B carrier state complicating childbirth: Secondary | ICD-10-CM | POA: Diagnosis present

## 2022-01-17 DIAGNOSIS — O365930 Maternal care for other known or suspected poor fetal growth, third trimester, not applicable or unspecified: Principal | ICD-10-CM | POA: Diagnosis present

## 2022-01-17 DIAGNOSIS — O365990 Maternal care for other known or suspected poor fetal growth, unspecified trimester, not applicable or unspecified: Principal | ICD-10-CM | POA: Diagnosis present

## 2022-01-17 LAB — TYPE AND SCREEN
ABO/RH(D): O POS
Antibody Screen: NEGATIVE

## 2022-01-17 LAB — CBC
HCT: 36 % (ref 36.0–46.0)
Hemoglobin: 12 g/dL (ref 12.0–15.0)
MCH: 29.4 pg (ref 26.0–34.0)
MCHC: 33.3 g/dL (ref 30.0–36.0)
MCV: 88.2 fL (ref 80.0–100.0)
Platelets: 280 10*3/uL (ref 150–400)
RBC: 4.08 MIL/uL (ref 3.87–5.11)
RDW: 13.1 % (ref 11.5–15.5)
WBC: 4 10*3/uL (ref 4.0–10.5)
nRBC: 0 % (ref 0.0–0.2)

## 2022-01-17 LAB — RPR: RPR Ser Ql: NONREACTIVE

## 2022-01-17 MED ORDER — SIMETHICONE 80 MG PO CHEW: 80.0000 mg | CHEWABLE_TABLET | ORAL | Status: DC | PRN

## 2022-01-17 MED ORDER — LIDOCAINE HCL (PF) 1 % IJ SOLN
INTRAMUSCULAR | Status: DC | PRN
  Administered 2022-01-17: 5 mL via EPIDURAL

## 2022-01-17 MED ORDER — DIBUCAINE (PERIANAL) 1 % EX OINT: 1.0000 | TOPICAL_OINTMENT | CUTANEOUS | Status: DC | PRN

## 2022-01-17 MED ORDER — ACETAMINOPHEN 325 MG PO TABS: 650.0000 mg | ORAL_TABLET | ORAL | Status: DC | PRN

## 2022-01-17 MED ORDER — SOD CITRATE-CITRIC ACID 500-334 MG/5ML PO SOLN: 30.0000 mL | ORAL | Status: DC | PRN

## 2022-01-17 MED ORDER — ONDANSETRON HCL 4 MG/2ML IJ SOLN: 4.0000 mg | Freq: Four times a day (QID) | INTRAMUSCULAR | Status: DC | PRN

## 2022-01-17 MED ORDER — TERBUTALINE SULFATE 1 MG/ML IJ SOLN: 0.2500 mg | Freq: Once | INTRAMUSCULAR | Status: DC | PRN

## 2022-01-17 MED ORDER — ACETAMINOPHEN 325 MG PO TABS
650.0000 mg | ORAL_TABLET | ORAL | Status: DC | PRN
Start: 2022-01-17 — End: 2022-01-17

## 2022-01-17 MED ORDER — EPHEDRINE 5 MG/ML INJ: 10.0000 mg | INTRAVENOUS | Status: DC | PRN

## 2022-01-17 MED ORDER — WITCH HAZEL-GLYCERIN EX PADS: 1.0000 | MEDICATED_PAD | CUTANEOUS | Status: DC | PRN

## 2022-01-17 MED ORDER — LACTATED RINGERS IV SOLN: 500.0000 mL | Freq: Once | INTRAVENOUS | Status: DC

## 2022-01-17 MED ORDER — COCONUT OIL OIL: 1.0000 | TOPICAL_OIL | Status: DC | PRN

## 2022-01-17 MED ORDER — OXYTOCIN 10 UNIT/ML IJ SOLN: 10.0000 [IU] | Freq: Once | INTRAMUSCULAR | Status: DC

## 2022-01-17 MED ORDER — PRENATAL MULTIVITAMIN CH
1.0000 | ORAL_TABLET | Freq: Every day | ORAL | Status: DC
  Administered 2022-01-18: 1 via ORAL
  Filled 2022-01-17: qty 1

## 2022-01-17 MED ORDER — MISOPROSTOL 50MCG HALF TABLET
50.0000 ug | ORAL_TABLET | Freq: Once | ORAL | Status: AC
  Administered 2022-01-17: 50 ug via ORAL
  Filled 2022-01-17: qty 1

## 2022-01-17 MED ORDER — SENNOSIDES-DOCUSATE SODIUM 8.6-50 MG PO TABS
2.0000 | ORAL_TABLET | Freq: Every day | ORAL | Status: DC
  Administered 2022-01-18: 2 via ORAL
  Filled 2022-01-17: qty 2

## 2022-01-17 MED ORDER — PENICILLIN G POT IN DEXTROSE 60000 UNIT/ML IV SOLN
3.0000 10*6.[IU] | INTRAVENOUS | Status: DC
  Administered 2022-01-17 (×2): 3 10*6.[IU] via INTRAVENOUS
  Filled 2022-01-17 (×2): qty 50

## 2022-01-17 MED ORDER — LACTATED RINGERS IV SOLN: 500.0000 mL | INTRAVENOUS | Status: DC | PRN

## 2022-01-17 MED ORDER — LIDOCAINE HCL (PF) 1 % IJ SOLN: 30.0000 mL | INTRAMUSCULAR | Status: DC | PRN

## 2022-01-17 MED ORDER — MISOPROSTOL 25 MCG QUARTER TABLET
25.0000 ug | ORAL_TABLET | Freq: Once | ORAL | Status: AC
Start: 2022-01-17 — End: 2022-01-17
  Administered 2022-01-17: 25 ug via VAGINAL
  Filled 2022-01-17: qty 1

## 2022-01-17 MED ORDER — DIPHENHYDRAMINE HCL 50 MG/ML IJ SOLN: 12.5000 mg | INTRAMUSCULAR | Status: DC | PRN

## 2022-01-17 MED ORDER — ONDANSETRON HCL 4 MG PO TABS: 4.0000 mg | ORAL_TABLET | ORAL | Status: DC | PRN

## 2022-01-17 MED ORDER — OXYTOCIN-SODIUM CHLORIDE 30-0.9 UT/500ML-% IV SOLN
1.0000 m[IU]/min | INTRAVENOUS | Status: DC
  Administered 2022-01-17: 2 m[IU]/min via INTRAVENOUS
  Filled 2022-01-17: qty 500

## 2022-01-17 MED ORDER — DIPHENHYDRAMINE HCL 25 MG PO CAPS: 25.0000 mg | ORAL_CAPSULE | Freq: Four times a day (QID) | ORAL | Status: DC | PRN

## 2022-01-17 MED ORDER — LACTATED RINGERS IV SOLN: INTRAVENOUS | Status: DC

## 2022-01-17 MED ORDER — PHENYLEPHRINE 80 MCG/ML (10ML) SYRINGE FOR IV PUSH (FOR BLOOD PRESSURE SUPPORT): 80.0000 ug | PREFILLED_SYRINGE | INTRAVENOUS | Status: DC | PRN

## 2022-01-17 MED ORDER — FENTANYL-BUPIVACAINE-NACL 0.5-0.125-0.9 MG/250ML-% EP SOLN
EPIDURAL | Status: DC | PRN
  Administered 2022-01-17: 12 mL/h via EPIDURAL

## 2022-01-17 MED ORDER — OXYTOCIN BOLUS FROM INFUSION
333.0000 mL | Freq: Once | INTRAVENOUS | Status: AC
  Administered 2022-01-17: 333 mL via INTRAVENOUS

## 2022-01-17 MED ORDER — IBUPROFEN 600 MG PO TABS
600.0000 mg | ORAL_TABLET | Freq: Four times a day (QID) | ORAL | Status: DC
  Administered 2022-01-17 – 2022-01-18 (×3): 600 mg via ORAL
  Filled 2022-01-17 (×4): qty 1

## 2022-01-17 MED ORDER — SODIUM CHLORIDE 0.9 % IV SOLN
5.0000 10*6.[IU] | Freq: Once | INTRAVENOUS | Status: AC
  Administered 2022-01-17: 5 10*6.[IU] via INTRAVENOUS
  Filled 2022-01-17: qty 5

## 2022-01-17 MED ORDER — MISOPROSTOL 50MCG HALF TABLET: 50.0000 ug | ORAL_TABLET | ORAL | Status: DC | PRN

## 2022-01-17 MED ORDER — EPHEDRINE 5 MG/ML INJ
10.0000 mg | INTRAVENOUS | Status: DC | PRN
Start: 2022-01-17 — End: 2022-01-17

## 2022-01-17 MED ORDER — ZOLPIDEM TARTRATE 5 MG PO TABS: 5.0000 mg | ORAL_TABLET | Freq: Every evening | ORAL | Status: DC | PRN

## 2022-01-17 MED ORDER — FERROUS SULFATE 325 (65 FE) MG PO TABS
325.0000 mg | ORAL_TABLET | Freq: Two times a day (BID) | ORAL | Status: DC
Start: 2022-01-18 — End: 2022-01-19
  Administered 2022-01-18 (×2): 325 mg via ORAL
  Filled 2022-01-17 (×2): qty 1

## 2022-01-17 MED ORDER — OXYTOCIN-SODIUM CHLORIDE 30-0.9 UT/500ML-% IV SOLN: 2.5000 [IU]/h | INTRAVENOUS | Status: DC

## 2022-01-17 MED ORDER — BENZOCAINE-MENTHOL 20-0.5 % EX AERO: 1.0000 | INHALATION_SPRAY | CUTANEOUS | Status: DC | PRN

## 2022-01-17 MED ORDER — FENTANYL-BUPIVACAINE-NACL 0.5-0.125-0.9 MG/250ML-% EP SOLN
12.0000 mL/h | EPIDURAL | Status: DC | PRN
  Filled 2022-01-17: qty 250

## 2022-01-17 MED ORDER — OXYCODONE-ACETAMINOPHEN 5-325 MG PO TABS: 1.0000 | ORAL_TABLET | ORAL | Status: DC | PRN

## 2022-01-17 MED ORDER — ONDANSETRON HCL 4 MG/2ML IJ SOLN: 4.0000 mg | INTRAMUSCULAR | Status: DC | PRN

## 2022-01-17 MED ORDER — OXYCODONE-ACETAMINOPHEN 5-325 MG PO TABS: 2.0000 | ORAL_TABLET | ORAL | Status: DC | PRN

## 2022-01-17 NOTE — Anesthesia Preprocedure Evaluation (Addendum)
Anesthesia Evaluation  Patient identified by MRN, date of birth, ID band Patient awake    Reviewed: Allergy & Precautions, NPO status , Patient's Chart, lab work & pertinent test results  Airway Mallampati: II  TM Distance: >3 FB Neck ROM: Full    Dental no notable dental hx. (+) Teeth Intact, Dental Advisory Given   Pulmonary neg pulmonary ROS,    Pulmonary exam normal breath sounds clear to auscultation       Cardiovascular Exercise Tolerance: Good negative cardio ROS Normal cardiovascular exam Rhythm:Regular Rate:Normal     Neuro/Psych negative neurological ROS  negative psych ROS   GI/Hepatic negative GI ROS, Neg liver ROS,   Endo/Other  negative endocrine ROS  Renal/GU negative Renal ROS     Musculoskeletal negative musculoskeletal ROS (+)   Abdominal (+) + obese (BMI 39.0),   Peds  Hematology Lab Results      Component                Value               Date                      WBC                      4.0                 01/17/2022                HGB                      12.0                01/17/2022                HCT                      36.0                01/17/2022                MCV                      88.2                01/17/2022                PLT                      280                 01/17/2022              Anesthesia Other Findings NKA  Reproductive/Obstetrics (+) Pregnancy                             Anesthesia Physical Anesthesia Plan  ASA: 3  Anesthesia Plan: Epidural   Post-op Pain Management:    Induction:   PONV Risk Score and Plan:   Airway Management Planned:   Additional Equipment:   Intra-op Plan:   Post-operative Plan:   Informed Consent: I have reviewed the patients History and Physical, chart, labs and discussed the procedure including the risks, benefits and alternatives for the proposed anesthesia with the patient or authorized  representative who has indicated his/her understanding and acceptance.  Plan Discussed with:   Anesthesia Plan Comments: (38 wk G3P2 w BMI 39 for LEA)        Anesthesia Quick Evaluation

## 2022-01-17 NOTE — Anesthesia Procedure Notes (Signed)
Epidural Patient location during procedure: OB Start time: 01/17/2022 3:23 PM End time: 01/17/2022 3:39 PM  Staffing Anesthesiologist: Trevor Iha, MD Performed: anesthesiologist   Preanesthetic Checklist Completed: patient identified, IV checked, site marked, risks and benefits discussed, surgical consent, monitors and equipment checked, pre-op evaluation and timeout performed  Epidural Patient position: sitting Prep: DuraPrep and site prepped and draped Patient monitoring: continuous pulse ox and blood pressure Approach: midline Location: L3-L4 Injection technique: LOR air  Needle:  Needle type: Tuohy  Needle gauge: 17 G Needle length: 9 cm and 9 Needle insertion depth: 9 cm Catheter type: closed end flexible Catheter size: 19 Gauge Catheter at skin depth: 15 cm Test dose: negative  Assessment Events: blood not aspirated, injection not painful, no injection resistance, no paresthesia and negative IV test  Additional Notes Patient identified. Risks/Benefits/Options discussed with patient including but not limited to bleeding, infection, nerve damage, paralysis, failed block, incomplete pain control, headache, blood pressure changes, nausea, vomiting, reactions to medication both or allergic, itching and postpartum back pain. Confirmed with bedside nurse the patient's most recent platelet count. Confirmed with patient that they are not currently taking any anticoagulation, have any bleeding history or any family history of bleeding disorders. Patient expressed understanding and wished to proceed. All questions were answered. Sterile technique was used throughout the entire procedure. Please see nursing notes for vital signs. Test dose was given through epidural needle and negative prior to continuing to dose epidural or start infusion. Warning signs of high block given to the patient including shortness of breath, tingling/numbness in hands, complete motor block, or any  concerning symptoms with instructions to call for help. Patient was given instructions on fall risk and not to get out of bed. All questions and concerns addressed with instructions to call with any issues.  2 Attempt (S) . Patient tolerated procedure well.

## 2022-01-17 NOTE — H&P (Signed)
Brandi Gross is a 31 y.o. female presenting @ 39 wk for IOL 2nd to IUGR. OB History     Gravida  3   Para  2   Term  2   Preterm      AB      Living  2      SAB      IAB      Ectopic      Multiple  0   Live Births  2          Past Medical History:  Diagnosis Date   Medical history non-contributory    Past Surgical History:  Procedure Laterality Date   NO PAST SURGERIES     Family History: family history includes Asthma in her brother; Cancer in her maternal aunt, maternal grandmother, and mother; Diabetes in her maternal aunt. Social History:  reports that she has never smoked. She has never used smokeless tobacco. She reports that she does not drink alcohol and does not use drugs.     Maternal Diabetes: No Genetic Screening: Normal Maternal Ultrasounds/Referrals: IUGR Fetal Ultrasounds or other Referrals:  Referred to Materal Fetal Medicine  Maternal Substance Abuse:  No Significant Maternal Medications:  Meds include: Other: baby ASA Significant Maternal Lab Results:  Group B Strep positive Number of Prenatal Visits:greater than 3 verified prenatal visits Other Comments:   IUGR  hx pp HTN  Review of Systems  All other systems reviewed and are negative.  History Dilation: 2 Effacement (%): Thick Station: -2 Exam by:: Brandi Hensen, MD Blood pressure 114/69, pulse 81, temperature 97.7 F (36.5 C), temperature source Oral, height 5\' 4"  (1.626 m), weight 103.1 kg, last menstrual period 04/26/2021, currently breastfeeding. Exam Physical Exam Constitutional:      Appearance: Normal appearance.  Eyes:     Extraocular Movements: Extraocular movements intact.  Cardiovascular:     Rate and Rhythm: Regular rhythm.  Abdominal:     Comments: gravid  Musculoskeletal:     Cervical back: Neck supple.  Skin:    General: Skin is warm.  Neurological:     General: No focal deficit present.     Mental Status: She is alert and oriented to person, place, and  time.  Psychiatric:        Mood and Affect: Mood normal.        Behavior: Behavior normal.     Prenatal labs: ABO, Rh: --/--/O POS (08/31 0815) Antibody: NEG (08/31 0815) Rubella: Immune (02/21 0000) RPR: Nonreactive (02/21 0000)  HBsAg: Negative (02/21 0000)  HIV: Non-reactive (02/21 0000)  GBS: Positive/-- (08/17 0000)   Assessment/Plan: IUGR Term gestation  GBS cx positive P) admit routine labs. Oral & vaginal cytotec. For cervical ripening. IV pitocin prn Analgesic/Epidural  prn. IV PCN    Brandi Gross 01/17/2022, 12:57 PM

## 2022-01-17 NOTE — Progress Notes (Signed)
Brandi Gross is a 31 y.o. G3P2002 at [redacted]w[redacted]d by LMP admitted for induction of labor due to Poor fetal growth.  Subjective: No chief complaint on file.   Objective: BP 114/69   Pulse 81   Temp 97.7 F (36.5 C) (Oral)   Ht 5\' 4"  (1.626 m)   Wt 103.1 kg   LMP 04/26/2021   BMI 39.02 kg/m  No intake/output data recorded. No intake/output data recorded.  FHT:  FHR: 140 bpm, variability: moderate,  accelerations:  Present,  decelerations:  Present variable UC:   irregular, every 1-3 minutes SVE:   2 cm dilated, thick effaced, -2 station   Labs: Lab Results  Component Value Date   WBC 4.0 01/17/2022   HGB 12.0 01/17/2022   HCT 36.0 01/17/2022   MCV 88.2 01/17/2022   PLT 280 01/17/2022    Assessment / Plan: IOL 2nd to  IUGR Term gestation GBS cx (+) on IV PCN  P) start pitocin augmentation due to too freq ctx for repeat cytotec. Cont IV PCN  Anticipated MOD:  NSVD  Brandi Gross 01/17/2022, 12:59 PM

## 2022-01-18 ENCOUNTER — Inpatient Hospital Stay (HOSPITAL_COMMUNITY): Payer: 59

## 2022-01-18 LAB — CBC
HCT: 34.8 % — ABNORMAL LOW (ref 36.0–46.0)
Hemoglobin: 11.8 g/dL — ABNORMAL LOW (ref 12.0–15.0)
MCH: 29.6 pg (ref 26.0–34.0)
MCHC: 33.9 g/dL (ref 30.0–36.0)
MCV: 87.4 fL (ref 80.0–100.0)
Platelets: 229 10*3/uL (ref 150–400)
RBC: 3.98 MIL/uL (ref 3.87–5.11)
RDW: 13.1 % (ref 11.5–15.5)
WBC: 7.7 10*3/uL (ref 4.0–10.5)
nRBC: 0 % (ref 0.0–0.2)

## 2022-01-18 NOTE — Anesthesia Postprocedure Evaluation (Signed)
Anesthesia Post Note  Patient: Brandi Gross  Procedure(s) Performed: AN AD HOC LABOR EPIDURAL     Patient location during evaluation: Mother Baby Anesthesia Type: Epidural Level of consciousness: awake and alert Pain management: pain level controlled Vital Signs Assessment: post-procedure vital signs reviewed and stable Respiratory status: spontaneous breathing, nonlabored ventilation and respiratory function stable Cardiovascular status: stable Postop Assessment: no headache, no backache, epidural receding, no apparent nausea or vomiting, patient able to bend at knees, adequate PO intake and able to ambulate Anesthetic complications: no   No notable events documented.  Last Vitals:  Vitals:   01/18/22 0132 01/18/22 0515  BP: 99/67 96/65  Pulse: 68 (!) 55  Resp: 16 16  Temp: 36.7 C 36.5 C  SpO2: 97% 98%    Last Pain:  Vitals:   01/18/22 0516  TempSrc:   PainSc: 0-No pain   Pain Goal:                   [employer]

## 2022-01-18 NOTE — Lactation Note (Signed)
This note was copied from a baby's chart. Lactation Consultation Note  Patient Name: Brandi Gross [id number] Date: 01/18/2022 Reason for consult: Initial assessment;Early term 37-38.6wks;Infant < 6lbs Age:30 hours  P3, Mother is doubtful of her supply.  Mother exclusively pumped with her other two children.  Provided education regarding supply.   Suggest calling for help with latch as needed.  Encouraged her to go between both breasts when hand expressing. Mother is offering breast and mostly formula.  Her plan is to exclusively pump and bottle feed once home. Mother stated she would start pumping tonight.  LC set up DEBP and suggest mother review with RN as needed tonight.    Maternal Data Has patient been taught Hand Expression?: Yes Does the patient have breastfeeding experience prior to this delivery?: Yes How long did the patient breastfeed?:  (pumped 7 mos & 3 mos.)  Feeding Mother's Current Feeding Choice: Breast Milk and Formula   Lactation Tools Discussed/Used Tools: Pump Breast pump type: Double-Electric Breast Pump;Manual Pump Education: Setup, frequency, and cleaning;Milk Storage Reason for Pumping: stimulation Pumping frequency: PRN  Interventions Interventions: Education;DEBP;LC Services brochure  Discharge Pump: DEBP;Personal;Hands Free (Genie DEBP and Ameda Hands free Joy)  Consult Status Consult Status: Follow-up Date: 01/19/22 Follow-up type: In-patient    Brandi Gross [facility] 01/18/2022, 11:51 AM

## 2022-01-18 NOTE — Progress Notes (Signed)
PPD1 SVD:   S:  Pt reports feeling well. Requesting early d/c/ Tolerating po/ Voiding without problems/ No n/v/ Bleeding is light/ Pain controlled withprescription NSAID's including ibuprofen (Motrin)  Newborn info live female BRF   O:  A & O x 3 / VS: Blood pressure 96/65, pulse (!) 55, temperature 97.7 F (36.5 C), temperature source Oral, resp. rate 16, height 5\' 4"  (1.626 m), weight 103.1 kg, last menstrual period 04/26/2021, SpO2 98 %, unknown if currently breastfeeding.  LABS:  Results for orders placed or performed during the hospital encounter of 01/17/22 (from the past 24 hour(s))  CBC     Status: Abnormal   Collection Time: 01/18/22  5:24 AM  Result Value Ref Range   WBC 7.7 4.0 - 10.5 K/uL   RBC 3.98 3.87 - 5.11 MIL/uL   Hemoglobin 11.8 (L) 12.0 - 15.0 g/dL   HCT 03/20/22 (L) 24.4 - 01.0 %   MCV 87.4 80.0 - 100.0 fL   MCH 29.6 26.0 - 34.0 pg   MCHC 33.9 30.0 - 36.0 g/dL   RDW 27.2 53.6 - 64.4 %   Platelets 229 150 - 400 K/uL   nRBC 0.0 0.0 - 0.2 %    I&O: I/O last 3 completed shifts: In: 0  Out: 900 [Urine:800; Blood:100]   No intake/output data recorded.  Lungs: chest clear to P  Heart: regular rate and rhythm, S1, S2 normal, no murmur, click, rub or gallop  Abdomen: uterus firm at umb  Perineum: not inspected  Lochia: light  Extremities:no redness or tenderness in the calves or thighs, no edema    A/P: PPD # 1/ 03.4  Doing well  Continue routine post partum orders  D/c home if baby cleared by pediatrician D/c instructions reviewed

## 2022-01-18 NOTE — Discharge Summary (Signed)
Postpartum Discharge Summary  Date of Service updated***     Patient Name: Brandi Gross DOB: 1990-08-03 MRN: [id number]  Date of admission: 01/17/2022 Delivery date:01/17/2022  Delivering provider: Chelcie Estorga  Date of discharge: 01/18/2022  Admitting diagnosis: IUGR (intrauterine growth restriction) affecting care of mother [O36.5990] Postpartum care following vaginal delivery [Z39.2] Intrauterine pregnancy: [redacted]w[redacted]d    Secondary diagnosis:  Principal Problem:   IUGR (intrauterine growth restriction) affecting care of mother Active Problems:   Postpartum care following vaginal delivery  Additional problems: ***    Discharge diagnosis: {DX.:23714}                                              Post partum procedures:{Postpartum procedures:23558} Augmentation: {{IWPYKDXIPJAS:50539}Complications: {OB Labor/Delivery Complications:20784}  Hospital course: {Courses:23701}  Magnesium Sulfate received: {Mag received:30440022} BMZ received: {BMZ received:30440023} Rhophylac:{Rhophylac received:30440032} MMR:{MMR:30440033} T-DaP:{Tdap:23962} Flu: {{JQB:34193}Transfusion:{Transfusion received:30440034}  Physical exam  Vitals:   01/18/22 0132 01/18/22 0515 01/18/22 0850 01/18/22 1515  BP: 99/67 96/65 109/64 120/64  Pulse: 68 (!) 55 70 63  Resp: '16 16 16 16  ' Temp: 98 F (36.7 C) 97.7 F (36.5 C) 98.4 F (36.9 C) 98.8 F (37.1 C)  TempSrc: Oral Oral Oral Oral  SpO2: 97% 98% 99% 99%  Weight:      Height:       General: {Exam; general:21111117} Lochia: {Desc; appropriate/inappropriate:30686::"appropriate"} Uterine Fundus: {Desc; firm/soft:30687} Incision: {Exam; incision:21111123} DVT Evaluation: {Exam; dvt:2111122} Labs: Lab Results  Component Value Date   WBC 7.7 01/18/2022   HGB 11.8 (L) 01/18/2022   HCT 34.8 (L) 01/18/2022   MCV 87.4 01/18/2022   PLT 229 01/18/2022      Latest Ref Rng & Units 10/12/2018    7:43 AM  CMP  Glucose 70 - 99 mg/dL 80   BUN  6 - 20 mg/dL 19   Creatinine 0.44 - 1.00 mg/dL 0.55   Sodium 135 - 145 mmol/L 139   Potassium 3.5 - 5.1 mmol/L 3.4   Chloride 98 - 111 mmol/L 109   CO2 22 - 32 mmol/L 20   Calcium 8.9 - 10.3 mg/dL 8.8   Total Protein 6.5 - 8.1 g/dL 6.9   Total Bilirubin 0.3 - 1.2 mg/dL 0.5   Alkaline Phos 38 - 126 U/L 71   AST 15 - 41 U/L 14   ALT 0 - 44 U/L 13    Edinburgh Score:    01/17/2022    8:30 PM  Edinburgh Postnatal Depression Scale Screening Tool  I have been able to laugh and see the funny side of things. 0  I have looked forward with enjoyment to things. 0  I have blamed myself unnecessarily when things went wrong. 1  I have been anxious or worried for no good reason. 1  I have felt scared or panicky for no good reason. 1  Things have been getting on top of me. 0  I have been so unhappy that I have had difficulty sleeping. 0  I have felt sad or miserable. 1  I have been so unhappy that I have been crying. 0  The thought of harming myself has occurred to me. 0  Edinburgh Postnatal Depression Scale Total 4      After visit meds:  Allergies as of 01/18/2022   No Known Allergies      Medication List  TAKE these medications    ferrous sulfate 325 (65 FE) MG tablet Take 325 mg by mouth daily with breakfast.   prenatal multivitamin Tabs tablet Take 1 tablet by mouth daily at 12 noon.         Discharge home in stable condition Infant Feeding: {Baby feeding:23562} Infant Disposition:{CHL IP OB HOME WITH FHPDFG:09200} Discharge instruction: per After Visit Summary and Postpartum booklet. Activity: Advance as tolerated. Pelvic rest for 6 weeks.  Diet: {OB diet:21111121} Anticipated Birth Control: {Birth Control:23956} Postpartum Appointment:{Outpatient follow up:23559} Additional Postpartum F/U: {PP Procedure:23957} Future Appointments:No future appointments. Follow up Visit:  Follow-up Information     Servando Salina, MD Follow up in 6 week(s).   Specialty:  Obstetrics and Gynecology Contact information: [address] [place] [address] [phone]                     01/18/2022 Marvene Staff, MD

## 2022-01-18 NOTE — Discharge Instructions (Signed)
Call if temperature greater than equal to 100.4, nothing per vagina for 4-6 weeks or severe nausea vomiting, increased incisional pain , drainage or redness in the incision site, no straining with bowel movements, showers no bath °

## 2022-01-22 LAB — SURGICAL PATHOLOGY

## 2022-01-24 ENCOUNTER — Encounter (HOSPITAL_COMMUNITY): Payer: Self-pay

## 2022-01-24 ENCOUNTER — Ambulatory Visit: Payer: Self-pay | Admitting: *Deleted

## 2022-01-24 ENCOUNTER — Emergency Department (HOSPITAL_COMMUNITY)
Admission: EM | Admit: 2022-01-24 | Discharge: 2022-01-24 | Disposition: A | Discharge status: Home / Self Care | Payer: 59 | Attending: Emergency Medicine | Admitting: Emergency Medicine

## 2022-01-24 ENCOUNTER — Other Ambulatory Visit: Payer: Self-pay

## 2022-01-24 DIAGNOSIS — R509 Fever, unspecified: Secondary | ICD-10-CM

## 2022-01-24 DIAGNOSIS — O9279 Other disorders of lactation: Secondary | ICD-10-CM

## 2022-01-24 DIAGNOSIS — E876 Hypokalemia: Secondary | ICD-10-CM | POA: Diagnosis not present

## 2022-01-24 DIAGNOSIS — N61 Mastitis without abscess: Secondary | ICD-10-CM | POA: Insufficient documentation

## 2022-01-24 LAB — CBC WITH DIFFERENTIAL/PLATELET
Abs Immature Granulocytes: 0.04 10*3/uL (ref 0.00–0.07)
Basophils Absolute: 0 10*3/uL (ref 0.0–0.1)
Basophils Relative: 0 %
Eosinophils Absolute: 0.1 10*3/uL (ref 0.0–0.5)
Eosinophils Relative: 1 %
HCT: 42.6 % (ref 36.0–46.0)
Hemoglobin: 14.1 g/dL (ref 12.0–15.0)
Immature Granulocytes: 0 %
Lymphocytes Relative: 9 %
Lymphs Abs: 1 10*3/uL (ref 0.7–4.0)
MCH: 30 pg (ref 26.0–34.0)
MCHC: 33.1 g/dL (ref 30.0–36.0)
MCV: 90.6 fL (ref 80.0–100.0)
Monocytes Absolute: 0.2 10*3/uL (ref 0.1–1.0)
Monocytes Relative: 2 %
Neutro Abs: 8.8 10*3/uL — ABNORMAL HIGH (ref 1.7–7.7)
Neutrophils Relative %: 88 %
Platelets: 278 10*3/uL (ref 150–400)
RBC: 4.7 MIL/uL (ref 3.87–5.11)
RDW: 12.8 % (ref 11.5–15.5)
WBC: 10.2 10*3/uL (ref 4.0–10.5)
nRBC: 0 % (ref 0.0–0.2)

## 2022-01-24 LAB — BASIC METABOLIC PANEL
Anion gap: 11 (ref 5–15)
BUN: 15 mg/dL (ref 6–20)
CO2: 13 mmol/L — ABNORMAL LOW (ref 22–32)
Calcium: 8.3 mg/dL — ABNORMAL LOW (ref 8.9–10.3)
Chloride: 109 mmol/L (ref 98–111)
Creatinine, Ser: 0.82 mg/dL (ref 0.44–1.00)
GFR, Estimated: >60 mL/min (ref >60)
Glucose, Bld: 93 mg/dL (ref 70–99)
Potassium: 3.2 mmol/L — ABNORMAL LOW (ref 3.5–5.1)
Sodium: 133 mmol/L — ABNORMAL LOW (ref 135–145)

## 2022-01-24 LAB — LACTIC ACID, PLASMA: Lactic Acid, Venous: 0.9 mmol/L (ref 0.5–1.9)

## 2022-01-24 MED ORDER — POTASSIUM CHLORIDE CRYS ER 20 MEQ PO TBCR
40.0000 meq | EXTENDED_RELEASE_TABLET | Freq: Once | ORAL | Status: AC
  Administered 2022-01-24: 40 meq via ORAL
  Filled 2022-01-24: qty 2

## 2022-01-24 MED ORDER — POTASSIUM CHLORIDE CRYS ER 20 MEQ PO TBCR
20.0000 meq | EXTENDED_RELEASE_TABLET | Freq: Two times a day (BID) | ORAL | 0 refills | Status: DC
Start: 2022-01-24 — End: 2022-02-13

## 2022-01-24 NOTE — ED Triage Notes (Signed)
Pt here for fevers, pt dx w mastitis yesterday and was prescribed antibiotics, reports no relief. Pt delivered baby a week ago and has been using a breast pump. Pt reports taken tylenol earlier today.

## 2022-01-24 NOTE — ED Provider Triage Note (Signed)
Emergency Medicine Provider Triage Evaluation Note  Brandi Gross , a 31 y.o. female  was evaluated in triage.  Pt complains of fever x2 days.  First appointment yesterday, patient delivered a week ago and has been pumping.  Diagnosed with mastitis, been taking dicloxacillin 4 times a day.  She still has a fever which worsened today, denies any purulent drainage or erythema.  States she there is a clogged duct in the right breast..  Review of Systems  Per HPI  Physical Exam  BP (!) 142/84 (BP Location: Right Arm)   Pulse (!) 124   Temp 100.2 F (37.9 C) (Oral)   Resp 20   SpO2 97%  Gen:   Awake, no distress   Resp:  Normal effort  MSK:   Moves extremities without difficulty  Other:    Medical Decision Making  Medically screening exam initiated at 5:01 PM.  Appropriate orders placed.  Brandi Gross was informed that the remainder of the evaluation will be completed by another provider, this initial triage assessment does not replace that evaluation, and the importance of remaining in the ED until their evaluation is complete.  Hepatitis B patient has an elevated temperature and is tachycardic in the 120s so we will check septic labs.   Theron Arista, PA-C 01/24/22 1703

## 2022-01-24 NOTE — ED Provider Notes (Signed)
[facility] EMERGENCY DEPARTMENT Provider Note   CSN: [id number] Arrival date & time: 01/24/22  1618     History  Chief Complaint  Patient presents with   Fever    Brandi Gross is a 31 y.o. female who is postpartum x1 week (G3P3) and presents to the emergency department complaining of fever.  Patient just diagnosed with mastitis and a right breast clogged duct, and was given prescription for dicloxacillin 4 times a day for 7 days.  She had a televisit with urgent care after having right sided breast pain, tenderness, and chills. She states that yesterday was the first time taking the medicine and she believes that she took all 4 doses too close together.  She woke up today and was trying to take it her 6-hour time increments.  She woke up today after a nap and felt hot and sweaty.  She checked her temperature and it was 103 F.  She took some Tylenol and presented to the ER.  She has concerns that it could have been related to a medication reaction versus the mastitis infection.  She does believe that the clogged duct in her right breast is improving, and she has continued pumping.    Fever      Home Medications Prior to Admission medications   Medication Sig Start Date End Date Taking? Authorizing Provider  potassium chloride SA (KLOR-CON M) 20 MEQ tablet Take 1 tablet (20 mEq total) by mouth 2 (two) times daily. 01/24/22  Yes Kevonta Phariss T, PA-C  ferrous sulfate 325 (65 FE) MG tablet Take 325 mg by mouth daily with breakfast.    [provider]  Prenatal Vit-Fe Fumarate-FA (PRENATAL MULTIVITAMIN) TABS tablet Take 1 tablet by mouth daily at 12 noon. 08/01/16   Bovard-Stuckert, Augusto Gamble, MD      Allergies    Patient has no known allergies.    Review of Systems   Review of Systems  Constitutional:  Positive for fever.  Skin:        Breast pain and redness  All other systems reviewed and are negative.   Physical Exam Updated Vital Signs BP (!) 148/93    Pulse (!) 111   Temp 99 F (37.2 C) (Oral)   Resp 20   SpO2 100%  Physical Exam Vitals and nursing note reviewed.  Constitutional:      Appearance: Normal appearance.  HENT:     Head: Normocephalic and atraumatic.  Eyes:     Conjunctiva/sclera: Conjunctivae normal.  Pulmonary:     Effort: Pulmonary effort is normal. No respiratory distress.  Chest:     Comments: Patient using breast pump Skin:    General: Skin is warm and dry.  Neurological:     Mental Status: She is alert.  Psychiatric:        Mood and Affect: Mood normal.        Behavior: Behavior normal.     ED Results / Procedures / Treatments   Labs (all labs ordered are listed, but only abnormal results are displayed) Labs Reviewed  CBC WITH DIFFERENTIAL/PLATELET - Abnormal; Notable for the following components:      Result Value   Neutro Abs 8.8 (*)    All other components within normal limits  BASIC METABOLIC PANEL - Abnormal; Notable for the following components:   Sodium 133 (*)    Potassium 3.2 (*)    CO2 13 (*)    Calcium 8.3 (*)    All other components within normal  limits  CULTURE, BLOOD (ROUTINE X 2)  CULTURE, BLOOD (ROUTINE X 2)  LACTIC ACID, PLASMA  LACTIC ACID, PLASMA    EKG None  Radiology No results found.  Procedures Procedures    Medications Ordered in ED Medications  potassium chloride SA (KLOR-CON M) CR tablet 40 mEq (has no administration in time range)    ED Course/ Medical Decision Making/ A&P                           Medical Decision Making  This patient is a 31 y.o. female  who presents to the ED for concern of fever. Dx with mastitis yesterday on dicloxacillin.   Differential diagnoses prior to evaluation: The emergent differential diagnosis includes, but is not limited to, mastitis, sepsis, viral illness, medication reaction. This is not an exhaustive differential.   Past Medical History / Co-morbidities: postpartum x1 week (G3P3)  Additional history: Chart  reviewed. Pertinent results include: Started on dicloxacillin 500 mg four times daily as of yesterday  Physical Exam: Physical exam performed. The pertinent findings include: Afebrile. Tachycardic.  No acute distress.  Lab Tests/Imaging studies: I personally interpreted labs/imaging and the pertinent results include: BC unremarkable.  Sodium 133, potassium 3.2, calcium 8.3.  Patient states she had similar electrolyte abnormalities after her last pregnancy.  Lactic 0.9.  Medications: I ordered medication including potassium replacement.  I have reviewed the patients home medicines and have made adjustments as needed.   Disposition: After consideration of the diagnostic results and the patients response to treatment, I feel that emergency department workup does not suggest an emergent condition requiring admission or immediate intervention beyond what has been performed at this time. The plan is: Discharge to home with continued antibiotics and symptomatic management of mastitis.  Suspect that fever and tachycardia has been related to that.  Does not meet sepsis criteria.  The patient is safe for discharge and has been instructed to return immediately for worsening symptoms, change in symptoms or any other concerns.   Final Clinical Impression(s) / ED Diagnoses Final diagnoses:  Fever, unspecified  Mastitis  Clogged duct, postpartum  Hypokalemia    Rx / DC Orders ED Discharge Orders          Ordered    potassium chloride SA (KLOR-CON M) 20 MEQ tablet  2 times daily        01/24/22 1951           Portions of this report may have been transcribed using voice recognition software. Every effort was made to ensure accuracy; however, inadvertent computerized transcription errors may be present.    Jeanella Flattery 01/24/22 Thayer Dallas, MD 01/24/22 [phone]

## 2022-01-24 NOTE — Discharge Instructions (Addendum)
You were seen in the emergency department for fever.  As we discussed, your blood work looked reassuring. I think your fever likely came from your mastitis infection. I recommend continuing the antibiotic and ibuprofen/tylenol as needed.   I have sent some potassium replacement to your pharmacy for an additional day. I recommend following up with your OBGYN as soon as you're able.  Continue to monitor how you're doing and return to the ER for new or worsening symptoms.

## 2022-01-24 NOTE — Telephone Encounter (Signed)
  Chief Complaint: Medication Symptoms: None presently Frequency: NA Pertinent Negatives: Patient denies NA Disposition: [x] ED /[] Urgent Care (no appt availability in office) / [] Appointment(In office/virtual)/ []  [facility]/ [] Home Care/ [] Refused Recommended Disposition /[] [place] [facility]/ []  Follow-up with PCP Additional Notes: Pt states took antibiotic Dicloxacillin Q3 hours x 4 doses instead of Q6 hours. Taking for Mastitis. Also reports temp of 103.0 with tylenol an hour ago. Advised ED. States will follow disposition.  No PCP Reason for Disposition . MORE THAN A DOUBLE DOSE of a prescription or over-the-counter (OTC) drug  Answer Assessment - Initial Assessment Questions 1. NAME of MEDICINE: "What medicine(s) are you calling about?"     Dicloxacillin 2. QUESTION: "What is your question?" (e.g., double dose of medicine, side effect)     Took med every 3 hours instead of every 6. X 4 does since last evening. 3. PRESCRIBER: "Who prescribed the medicine?" Reason: if prescribed by specialist, call should be referred to that group.      4. SYMPTOMS: "Do you have any symptoms?" If Yes, ask: "What symptoms are you having?"  "How bad are the symptoms (e.g., mild, moderate, severe)     Temp 103.0 with mastitis.  Protocols used: Medication Question Call-A-AH

## 2022-01-29 ENCOUNTER — Telehealth (HOSPITAL_COMMUNITY): Payer: Self-pay

## 2022-01-29 LAB — CULTURE, BLOOD (ROUTINE X 2)
Culture: NO GROWTH
Special Requests: ADEQUATE

## 2022-01-29 NOTE — Telephone Encounter (Signed)
Patient reports feeling good. "Getting over mastitis. I got treated at a urgent care but I also talked to my OB. I have decided I want my milk to dry up." RN offered lactation resources but patient declines. RN reviewed using cabbage leaves to help milk dry up. Patient declines questions about her health and healing.  Patient reports that baby is doing well. Eating, peeing/pooping, and gaining weight well. Baby sleeps in a bassinet. RN reviewed ABC's of safe sleep with patient. Patient declines any questions or concerns about baby.  EPDS score is 5.  Marcelino Duster [facility]  01/29/22,1517

## 2022-02-13 ENCOUNTER — Encounter (HOSPITAL_COMMUNITY): Payer: Self-pay | Admitting: Obstetrics and Gynecology

## 2022-02-13 ENCOUNTER — Inpatient Hospital Stay (HOSPITAL_COMMUNITY)
Admission: AD | Admit: 2022-02-13 | Discharge: 2022-02-13 | Disposition: A | Discharge status: Home / Self Care | Payer: 59 | Attending: Obstetrics and Gynecology | Admitting: Obstetrics and Gynecology

## 2022-02-13 ENCOUNTER — Inpatient Hospital Stay (EMERGENCY_DEPARTMENT_HOSPITAL): Payer: 59

## 2022-02-13 ENCOUNTER — Other Ambulatory Visit: Payer: Self-pay

## 2022-02-13 DIAGNOSIS — O165 Unspecified maternal hypertension, complicating the puerperium: Secondary | ICD-10-CM

## 2022-02-13 DIAGNOSIS — M79605 Pain in left leg: Secondary | ICD-10-CM

## 2022-02-13 DIAGNOSIS — O909 Complication of the puerperium, unspecified: Secondary | ICD-10-CM | POA: Diagnosis not present

## 2022-02-13 DIAGNOSIS — R030 Elevated blood-pressure reading, without diagnosis of hypertension: Secondary | ICD-10-CM | POA: Insufficient documentation

## 2022-02-13 DIAGNOSIS — M79604 Pain in right leg: Secondary | ICD-10-CM

## 2022-02-13 DIAGNOSIS — M79661 Pain in right lower leg: Secondary | ICD-10-CM

## 2022-02-13 LAB — COMPREHENSIVE METABOLIC PANEL
ALT: 17 U/L (ref 0–44)
AST: 18 U/L (ref 15–41)
Albumin: 3.3 g/dL — ABNORMAL LOW (ref 3.5–5.0)
Alkaline Phosphatase: 152 U/L — ABNORMAL HIGH (ref 38–126)
Anion gap: 9 (ref 5–15)
BUN: 13 mg/dL (ref 6–20)
CO2: 21 mmol/L — ABNORMAL LOW (ref 22–32)
Calcium: 9.3 mg/dL (ref 8.9–10.3)
Chloride: 111 mmol/L (ref 98–111)
Creatinine, Ser: 0.7 mg/dL (ref 0.44–1.00)
GFR, Estimated: >60 mL/min (ref >60)
Glucose, Bld: 83 mg/dL (ref 70–99)
Potassium: 3.8 mmol/L (ref 3.5–5.1)
Sodium: 141 mmol/L (ref 135–145)
Total Bilirubin: 0.5 mg/dL (ref 0.3–1.2)
Total Protein: 6.5 g/dL (ref 6.5–8.1)

## 2022-02-13 LAB — CBC
HCT: 36.5 % (ref 36.0–46.0)
Hemoglobin: 12.2 g/dL (ref 12.0–15.0)
MCH: 29.4 pg (ref 26.0–34.0)
MCHC: 33.4 g/dL (ref 30.0–36.0)
MCV: 88 fL (ref 80.0–100.0)
Platelets: 300 10*3/uL (ref 150–400)
RBC: 4.15 MIL/uL (ref 3.87–5.11)
RDW: 13 % (ref 11.5–15.5)
WBC: 4.1 10*3/uL (ref 4.0–10.5)
nRBC: 0 % (ref 0.0–0.2)

## 2022-02-13 LAB — PROTEIN / CREATININE RATIO, URINE
Creatinine, Urine: 103 mg/dL
Protein Creatinine Ratio: 0.09 mg/mg{Cre} (ref 0.00–0.15)
Total Protein, Urine: 9 mg/dL

## 2022-02-13 MED ORDER — NIFEDIPINE ER OSMOTIC RELEASE 30 MG PO TB24: 30.0000 mg | ORAL_TABLET | Freq: Every day | ORAL | 6 refills | Status: DC

## 2022-02-13 NOTE — Progress Notes (Signed)
BLE venous duplex has been completed.  Preliminary results given to Juliann Pulse, RN.   Results can be found under chart review under CV PROC. 02/13/2022 12:03 PM Shlonda Dolloff RVT, RDMS

## 2022-02-13 NOTE — MAU Provider Note (Signed)
History     Chief Complaint  Patient presents with   Hypertension   G3P3 MBF  3 wk postpartum sent in by [facility] start nurse for evaluation of elevated BP.  This was a routine home visit. Pt denies h/a, visual changes . C/o bilateral leg pain but no edema Had BP problem with 1st pregnancy. No problem during this pregnancy. C/o increased milk production Wants to stop. Used cabbage leave w/o success  OB History     Gravida  3   Para  3   Term  3   Preterm      AB      Living  3      SAB      IAB      Ectopic      Multiple  0   Live Births  3           Past Medical History:  Diagnosis Date   Medical history non-contributory     Past Surgical History:  Procedure Laterality Date   NO PAST SURGERIES      Family History  Problem Relation Age of Onset   Cancer Mother    Asthma Brother    Diabetes Maternal Aunt    Cancer Maternal Aunt        breast   Cancer Maternal Grandmother        breast   Heart disease Neg Hx    Hypertension Neg Hx    Stroke Neg Hx     Social History   Tobacco Use   Smoking status: Never   Smokeless tobacco: Never  Vaping Use   Vaping Use: Never used  Substance Use Topics   Alcohol use: No   Drug use: No    Allergies: No Known Allergies  Medications Prior to Admission  Medication Sig Dispense Refill Last Dose   acetaminophen (TYLENOL) 500 MG tablet Take 1,000 mg by mouth every 6 (six) hours as needed for mild pain.   Past Week   ferrous sulfate 325 (65 FE) MG tablet Take 325 mg by mouth daily with breakfast.   02/13/2022   ibuprofen (ADVIL) 200 MG tablet Take 200 mg by mouth every 6 (six) hours as needed.   Past Week   Prenatal Vit-Fe Fumarate-FA (PRENATAL MULTIVITAMIN) TABS tablet Take 1 tablet by mouth daily at 12 noon. 100 tablet 3 02/13/2022   potassium chloride SA (KLOR-CON M) 20 MEQ tablet Take 1 tablet (20 mEq total) by mouth 2 (two) times daily. 2 tablet 0      Physical Exam   Blood pressure  133/80, pulse (!) 53, temperature 98.4 F (36.9 C), temperature source Oral, resp. rate 16, height 5\' 4"  (1.626 m), weight 96.5 kg, SpO2 99 %, currently breastfeeding.  General appearance: alert, cooperative, and no distress Lungs: clear to auscultation bilaterally Heart: regular rate and rhythm, S1, S2 normal, no murmur, click, rub or gallop Extremities: extremities normal, atraumatic, no cyanosis or edema and Homans sign is negative, no sign of DVT  IMP: postpartum HTN Leg cramping P) dopplers of leg. PIH labs ED Course   MDM  Addendum VAS [place] LOWER EXTREMITY VENOUS (DVT) (ONLY [facility] & [facility])  Result Date: 02/13/2022  Lower Venous DVT Study Patient Name:  Brandi Gross  Date of Exam:   02/13/2022 Medical Rec #: [id number]       Accession #:    [id number] Date of Birth: [birth year]       Patient Gender: F Patient Age:  31 years Exam Location:  [facility] Procedure:      VAS [place] LOWER EXTREMITY VENOUS (DVT) Referring Phys: Alanda Slim Raine Blodgett --------------------------------------------------------------------------------  Indications: BLE pain - recent pregnancy/delivery on 01/18/22.  Performing Technologist: Rogelia Rohrer RVT, RDMS  Examination Guidelines: A complete evaluation includes B-mode imaging, spectral Doppler, color Doppler, and power Doppler as needed of all accessible portions of each vessel. Bilateral testing is considered an integral part of a complete examination. Limited examinations for reoccurring indications may be performed as noted. The reflux portion of the exam is performed with the patient in reverse Trendelenburg.  +---------+---------------+---------+-----------+----------+--------------+ RIGHT    CompressibilityPhasicitySpontaneityPropertiesThrombus Aging +---------+---------------+---------+-----------+----------+--------------+ CFV      Full           Yes      Yes                                  +---------+---------------+---------+-----------+----------+--------------+ SFJ      Full                                                        +---------+---------------+---------+-----------+----------+--------------+ FV Prox  Full           Yes      Yes                                 +---------+---------------+---------+-----------+----------+--------------+ FV Mid   Full           Yes      Yes                                 +---------+---------------+---------+-----------+----------+--------------+ FV DistalFull           Yes      Yes                                 +---------+---------------+---------+-----------+----------+--------------+ PFV      Full                                                        +---------+---------------+---------+-----------+----------+--------------+ POP      Full           Yes      Yes                                 +---------+---------------+---------+-----------+----------+--------------+ PTV      Full                                                        +---------+---------------+---------+-----------+----------+--------------+ PERO     Full                                                        +---------+---------------+---------+-----------+----------+--------------+   +---------+---------------+---------+-----------+----------+--------------+  LEFT     CompressibilityPhasicitySpontaneityPropertiesThrombus Aging +---------+---------------+---------+-----------+----------+--------------+ CFV      Full           Yes      Yes                                 +---------+---------------+---------+-----------+----------+--------------+ SFJ      Full                                                        +---------+---------------+---------+-----------+----------+--------------+ FV Prox  Full           Yes      Yes                                  +---------+---------------+---------+-----------+----------+--------------+ FV Mid   Full           Yes      Yes                                 +---------+---------------+---------+-----------+----------+--------------+ FV DistalFull           Yes      Yes                                 +---------+---------------+---------+-----------+----------+--------------+ PFV      Full                                                        +---------+---------------+---------+-----------+----------+--------------+ POP      Full           Yes      Yes                                 +---------+---------------+---------+-----------+----------+--------------+ PTV      Full                                                        +---------+---------------+---------+-----------+----------+--------------+ PERO     Full                                                        +---------+---------------+---------+-----------+----------+--------------+     Summary: BILATERAL: - No evidence of deep vein thrombosis seen in the lower extremities, bilaterally. -No evidence of popliteal cyst, bilaterally.   *See table(s) above for measurements and observations. Electronically signed by Harold Barban MD on 02/13/2022 at 9:08:27 PM.    Final     Recent Results (from the past 2160 hour(s))  OB RESULT CONSOLE Group  B Strep     Status: None   Collection Time: 01/03/22 12:00 AM  Result Value Ref Range   GBS Positive   Type and screen [place]     Status: None   Collection Time: 01/17/22  8:15 AM  Result Value Ref Range   ABO/RH(D) O POS    Antibody Screen NEG    Sample Expiration      01/20/2022,2359 Performed at [facility], [place] [address] [place], [place] [address]   CBC     Status: None   Collection Time: 01/17/22  8:20 AM  Result Value Ref Range   WBC 4.0 4.0 - 10.5 K/uL   RBC 4.08 3.87 - 5.11 MIL/uL   Hemoglobin 12.0 12.0 - 15.0 g/dL   HCT 36.0 36.0 -  46.0 %   MCV 88.2 80.0 - 100.0 fL   MCH 29.4 26.0 - 34.0 pg   MCHC 33.3 30.0 - 36.0 g/dL   RDW 13.1 11.5 - 15.5 %   Platelets 280 150 - 400 K/uL   nRBC 0.0 0.0 - 0.2 %    Comment: Performed at [facility], [place] [address] [place], [place] [address]  RPR     Status: None   Collection Time: 01/17/22  8:20 AM  Result Value Ref Range   RPR Ser Ql NON REACTIVE NON REACTIVE    Comment: Performed at [facility] [address], [place], [place] [address]  Surgical pathology     Status: None   Collection Time: 01/17/22  6:00 PM  Result Value Ref Range   SURGICAL PATHOLOGY      SURGICAL PATHOLOGY CASE: [id number] PATIENT: [facility] Surgical Pathology Report     Clinical History: IUGR with possible abruption     FINAL MICROSCOPIC DIAGNOSIS:  A. PLACENTA, 38W: - Mature third trimester placenta (385 g) - Three vessel umbilical cord       GROSS DESCRIPTION:  Specimen received: Received fresh is a singleton placenta with attached umbilical cord and membranes Size and shape: Discoid measuring 17 x 16 x 2.5 cm Weight: 0000000 g Umbilical cord: 27 cm in length by 1.4 cm in diameter, consisting of 3 vessels without distinct lesions and a paracentral insertion, 7.5 cm from the disc edge Membranes: Pink-tan, translucent, and glistening with a marginal insertion Fetal surface: Purple-pink with arborizing vessels Maternal surface: Red-tan and intact Cut surface: Pink-red without distinct lesions Block summary: A1 umbilical cord and membrane roll A2-A4 full-thickness central placenta (KW, 01/18/2022)     Final Diagnosis perfo rmed by Jaquita Folds, MD.   Electronically signed 01/22/2022 Technical and / or Professional components performed at [facility], [place] [address], [place], [place] [address].  Immunohistochemistry Technical component (if applicable) was performed at [facility]. [address] [place], [place], [place] [address].   IMMUNOHISTOCHEMISTRY DISCLAIMER (if applicable): Some of these immunohistochemical stains may have been developed and the performance characteristics determine by [facility]. Some may not have been cleared or approved by the U.S. Food and Drug Administration. The FDA has determined that such clearance or approval is not necessary. This test is used for clinical purposes. It should not be regarded as investigational or for research. This laboratory is certified under the [place] (CLIA-88) as qualified to perform high complexity clinical laboratory testing.  The cont rols stained appropriately.   CBC     Status: Abnormal   Collection Time:  01/18/22  5:24 AM  Result Value Ref Range   WBC 7.7 4.0 - 10.5 K/uL   RBC 3.98 3.87 - 5.11 MIL/uL   Hemoglobin 11.8 (L) 12.0 - 15.0 g/dL   HCT 34.8 (L) 36.0 - 46.0 %   MCV 87.4 80.0 - 100.0 fL   MCH 29.6 26.0 - 34.0 pg   MCHC 33.9 30.0 - 36.0 g/dL   RDW 13.1 11.5 - 15.5 %   Platelets 229 150 - 400 K/uL   nRBC 0.0 0.0 - 0.2 %    Comment: Performed at [facility], [place] [address] [place], [place] [address]  Lactic acid, plasma     Status: None   Collection Time: 01/24/22  5:03 PM  Result Value Ref Range   Lactic Acid, Venous 0.9 0.5 - 1.9 mmol/L    Comment: Performed at [facility] [address], [place], [place] [address]  Blood culture (routine x 2)     Status: None   Collection Time: 01/24/22  5:03 PM   Specimen: BLOOD  Result Value Ref Range   Specimen Description BLOOD SITE NOT SPECIFIED    Special Requests      BOTTLES DRAWN AEROBIC AND ANAEROBIC Blood Culture adequate volume   Culture      NO GROWTH 5 DAYS Performed at [facility], [place] [address], [place], [place] [address]    Report Status 01/29/2022 FINAL   CBC with Differential     Status: Abnormal   Collection Time: 01/24/22  5:52 PM  Result Value Ref Range   WBC 10.2 4.0  - 10.5 K/uL   RBC 4.70 3.87 - 5.11 MIL/uL   Hemoglobin 14.1 12.0 - 15.0 g/dL   HCT 42.6 36.0 - 46.0 %   MCV 90.6 80.0 - 100.0 fL   MCH 30.0 26.0 - 34.0 pg   MCHC 33.1 30.0 - 36.0 g/dL   RDW 12.8 11.5 - 15.5 %   Platelets 278 150 - 400 K/uL   nRBC 0.0 0.0 - 0.2 %   Neutrophils Relative % 88 %   Neutro Abs 8.8 (H) 1.7 - 7.7 K/uL   Lymphocytes Relative 9 %   Lymphs Abs 1.0 0.7 - 4.0 K/uL   Monocytes Relative 2 %   Monocytes Absolute 0.2 0.1 - 1.0 K/uL   Eosinophils Relative 1 %   Eosinophils Absolute 0.1 0.0 - 0.5 K/uL   Basophils Relative 0 %   Basophils Absolute 0.0 0.0 - 0.1 K/uL   Immature Granulocytes 0 %   Abs Immature Granulocytes 0.04 0.00 - 0.07 K/uL    Comment: Performed at [facility] [address], [place],  [address]  Basic metabolic panel     Status: Abnormal   Collection Time: 01/24/22  5:52 PM  Result Value Ref Range   Sodium 133 (L) 135 - 145 mmol/L   Potassium 3.2 (L) 3.5 - 5.1 mmol/L   Chloride 109 98 - 111 mmol/L   CO2 13 (L) 22 - 32 mmol/L   Glucose, Bld 93 70 - 99 mg/dL    Comment: Glucose reference range applies only to samples taken after fasting for at least 8 hours.   BUN 15 6 - 20 mg/dL   Creatinine, Ser 0.82 0.44 - 1.00 mg/dL   Calcium 8.3 (L) 8.9 - 10.3 mg/dL   GFR, Estimated >60 >60 mL/min    Comment: (NOTE) Calculated using the CKD-EPI Creatinine Equation (2021)    Anion gap 11 5 -  15    Comment: Performed at [facility], [place] [address], [place], [place] [address]  Protein / creatinine ratio, urine     Status: None   Collection Time: 02/13/22 10:51 AM  Result Value Ref Range   Creatinine, Urine 103 mg/dL   Total Protein, Urine 9 mg/dL    Comment: NO NORMAL RANGE ESTABLISHED FOR THIS TEST   Protein Creatinine Ratio 0.09 0.00 - 0.15 mg/mg[Cre]    Comment: Performed at [facility] [address] [place], [place] [address]  CBC     Status: None   Collection Time: 02/13/22 10:59 AM  Result Value Ref Range   WBC  4.1 4.0 - 10.5 K/uL   RBC 4.15 3.87 - 5.11 MIL/uL   Hemoglobin 12.2 12.0 - 15.0 g/dL   HCT 36.5 36.0 - 46.0 %   MCV 88.0 80.0 - 100.0 fL   MCH 29.4 26.0 - 34.0 pg   MCHC 33.4 30.0 - 36.0 g/dL   RDW 13.0 11.5 - 15.5 %   Platelets 300 150 - 400 K/uL   nRBC 0.0 0.0 - 0.2 %    Comment: Performed at [facility], [place] [address] [place], [place] [address]  Comprehensive metabolic panel     Status: Abnormal   Collection Time: 02/13/22 10:59 AM  Result Value Ref Range   Sodium 141 135 - 145 mmol/L   Potassium 3.8 3.5 - 5.1 mmol/L   Chloride 111 98 - 111 mmol/L   CO2 21 (L) 22 - 32 mmol/L   Glucose, Bld 83 70 - 99 mg/dL    Comment: Glucose reference range applies only to samples taken after fasting for at least 8 hours.   BUN 13 6 - 20 mg/dL   Creatinine, Ser 0.70 0.44 - 1.00 mg/dL   Calcium 9.3 8.9 - 10.3 mg/dL   Total Protein 6.5 6.5 - 8.1 g/dL   Albumin 3.3 (L) 3.5 - 5.0 g/dL   AST 18 15 - 41 U/L   ALT 17 0 - 44 U/L   Alkaline Phosphatase 152 (H) 38 - 126 U/L   Total Bilirubin 0.5 0.3 - 1.2 mg/dL   GFR, Estimated >60 >60 mL/min    Comment: (NOTE) Calculated using the CKD-EPI Creatinine Equation (2021)    Anion gap 9 5 - 15    Comment: Performed at [facility], [place] [address], [place], [place] [address]   Reviewed labs : nl dopplers HTN  postpartum P) start on procardia XL D/c home Check BP at home Keep pp appt. Instructions by RN regarding help to stop milk production given to pt  Marvene Staff, MD 1:05 PM 02/13/2022

## 2022-02-13 NOTE — MAU Note (Signed)
Vascular study negative per Vasc tech. See Progress note.

## 2022-02-13 NOTE — MAU Note (Signed)
Brandi Gross is a 31 y.o. here in MAU reporting: s/p SVD on 8/31. Woke up this AM and was not feeling right, states legs were hurting. Checked BP and it was 158/107.  Onset of complaint: today  Pain score: 3/10  Vitals:   02/13/22 1007  BP: (!) 144/84  Pulse: (!) 59  Resp: 16  Temp: 98.4 F (36.9 C)  SpO2: 99%     Lab orders placed from triage: none

## 2022-02-13 NOTE — MAU Provider Note (Signed)
S Ms. Brandi Gross is a 31 y.o. [phone] patient who presents to MAU today for evaluation of elevated blood pressures. She is being managed by Dr. Garwin Brothers.   O BP (!) 141/82   Pulse (!) 53   Temp 98.4 F (36.9 C) (Oral)   Resp 16   Ht 5\' 4"  (1.626 m)   Wt 96.5 kg   SpO2 99% Comment: room air  Breastfeeding Yes   BMI 36.53 kg/m  Physical Exam Vitals and nursing note reviewed.  Constitutional:      General: She is not in acute distress.    Appearance: She is well-developed.  HENT:     Head: Normocephalic.  Eyes:     Pupils: Pupils are equal, round, and reactive to light.  Cardiovascular:     Rate and Rhythm: Normal rate and regular rhythm.     Heart sounds: Normal heart sounds.  Pulmonary:     Effort: Pulmonary effort is normal. No respiratory distress.     Breath sounds: Normal breath sounds.  Abdominal:     General: Bowel sounds are normal. There is no distension.     Palpations: Abdomen is soft.     Tenderness: There is no abdominal tenderness.  Skin:    General: Skin is warm and dry.  Neurological:     Mental Status: She is alert and oriented to person, place, and time.     Motor: No abnormal muscle tone.     Coordination: Coordination normal.     Deep Tendon Reflexes: Reflexes are normal and symmetric. Reflexes normal.  Psychiatric:        Behavior: Behavior normal.        Thought Content: Thought content normal.        Judgment: Judgment normal.    A Medical screening exam complete 1. Elevated blood pressure reading   2. Postpartum state     P -Further management by MD -Medical screening completed  Wende Mott, CNM 02/13/2022 11:01 AM

## 2023-05-02 ENCOUNTER — Encounter: Payer: Self-pay | Admitting: Emergency Medicine

## 2023-05-02 ENCOUNTER — Ambulatory Visit
Admission: EM | Admit: 2023-05-02 | Discharge: 2023-05-02 | Disposition: A | Discharge status: Home / Self Care | Payer: BC Managed Care – PPO | Attending: Emergency Medicine | Admitting: Emergency Medicine

## 2023-05-02 DIAGNOSIS — J069 Acute upper respiratory infection, unspecified: Secondary | ICD-10-CM

## 2023-05-02 DIAGNOSIS — H66003 Acute suppurative otitis media without spontaneous rupture of ear drum, bilateral: Secondary | ICD-10-CM | POA: Diagnosis not present

## 2023-05-02 MED ORDER — AMOXICILLIN-POT CLAVULANATE 875-125 MG PO TABS: 1.0000 | ORAL_TABLET | Freq: Two times a day (BID) | ORAL | 0 refills | Status: AC

## 2023-05-02 NOTE — ED Triage Notes (Signed)
Patient c/o right ear pain that started on Monday.  Patient reports cold symptoms prior to her ear symptoms.

## 2023-05-02 NOTE — Discharge Instructions (Addendum)
Take the Augmentin twice daily for 10 days with food for treatment of your ear infection.  Take an over-the-counter probiotic 1 hour after each dose of antibiotic to prevent diarrhea.  Use over-the-counter Tylenol and ibuprofen as needed for pain or fever.  Place a hot water bottle, or heating pad, underneath your pillowcase at night to help dilate up your ear and aid in pain relief as well as resolution of the infection.  Return for reevaluation for any new or worsening symptoms.  

## 2023-05-02 NOTE — ED Provider Notes (Signed)
[facility]    CSN: [id number] Arrival date & time: 05/02/23  1716      History   Chief Complaint Chief Complaint  Patient presents with   Otalgia    right    HPI Brandi Gross is a 32 y.o. female.   HPI  32 year old female with no significant past medical history presents for evaluation of bilateral ear pain that started 5 days ago.  She reports that prior to the onset of her ear pain she was experiencing upper respiratory symptoms to include runny nose, nasal congestion.  She also had a sore throat which has since resolved.  She denies any cough.  She states that she is having ringing in both of her ears but denies any drainage from the ears.  She also denies fever.  Past Medical History:  Diagnosis Date   Medical history non-contributory     Patient Active Problem List   Diagnosis Date Noted   IUGR (intrauterine growth restriction) affecting care of mother 01/17/2022   Postpartum care following vaginal delivery 01/17/2022   Normal labor 07/30/2016   NSVD (normal spontaneous vaginal delivery) 07/30/2016   OBESITY 08/13/2007    Past Surgical History:  Procedure Laterality Date   NO PAST SURGERIES      OB History     Gravida  3   Para  3   Term  3   Preterm      AB      Living  3      SAB      IAB      Ectopic      Multiple  0   Live Births  3            Home Medications    Prior to Admission medications   Medication Sig Start Date End Date Taking? Authorizing Provider  amoxicillin-clavulanate (AUGMENTIN) 875-125 MG tablet Take 1 tablet by mouth every 12 (twelve) hours for 10 days. 05/02/23 05/12/23 Yes Becky Augusta, NP  acetaminophen (TYLENOL) 500 MG tablet Take 1,000 mg by mouth every 6 (six) hours as needed for mild pain.    [provider]  ibuprofen (ADVIL) 200 MG tablet Take 200 mg by mouth every 6 (six) hours as needed.    [provider]  Prenatal Vit-Fe Fumarate-FA (PRENATAL MULTIVITAMIN) TABS  tablet Take 1 tablet by mouth daily at 12 noon. 08/01/16   Bovard-Stuckert, Augusto Gamble, MD    Family History Family History  Problem Relation Age of Onset   Cancer Mother    Asthma Brother    Diabetes Maternal Aunt    Cancer Maternal Aunt        breast   Cancer Maternal Grandmother        breast   Heart disease Neg Hx    Hypertension Neg Hx    Stroke Neg Hx     Social History Social History   Tobacco Use   Smoking status: Never   Smokeless tobacco: Never  Vaping Use   Vaping status: Never Used  Substance Use Topics   Alcohol use: No   Drug use: No     Allergies   Patient has no known allergies.   Review of Systems Review of Systems  Constitutional:  Negative for fever.  HENT:  Positive for congestion, ear pain, rhinorrhea and tinnitus. Negative for ear discharge.   Respiratory:  Negative for cough.      Physical Exam Triage Vital Signs ED Triage Vitals  Encounter Vitals Group  BP      Systolic BP Percentile      Diastolic BP Percentile      Pulse      Resp      Temp      Temp src      SpO2      Weight      Height      Head Circumference      Peak Flow      Pain Score      Pain Loc      Pain Education      Exclude from Growth Chart    No data found.  Updated Vital Signs BP 130/83 (BP Location: Right Arm)   Pulse 83   Temp 98.8 F (37.1 C) (Oral)   Resp 14   Ht 5\' 4"  (1.626 m)   Wt 200 lb (90.7 kg)   LMP 04/25/2023   SpO2 98%   Breastfeeding No   BMI 34.33 kg/m   Visual Acuity Right Eye Distance:   Left Eye Distance:   Bilateral Distance:    Right Eye Near:   Left Eye Near:    Bilateral Near:     Physical Exam Vitals and nursing note reviewed.  Constitutional:      Appearance: Normal appearance. She is not ill-appearing.  HENT:     Head: Normocephalic and atraumatic.     Right Ear: Ear canal and external ear normal. There is no impacted cerumen.     Left Ear: Ear canal and external ear normal. There is no impacted cerumen.      Ears:     Comments: Erythema and injection to bilateral tympanic membranes.  Right greater than left.    Nose: Congestion and rhinorrhea present.     Comments: Patient mucosa is edematous with clear discharge in both nares.    Mouth/Throat:     Mouth: Mucous membranes are moist.     Pharynx: Oropharynx is clear. No oropharyngeal exudate or posterior oropharyngeal erythema.  Cardiovascular:     Rate and Rhythm: Normal rate and regular rhythm.     Pulses: Normal pulses.     Heart sounds: Normal heart sounds. No murmur heard.    No friction rub. No gallop.  Pulmonary:     Effort: Pulmonary effort is normal.     Breath sounds: Normal breath sounds. No wheezing, rhonchi or rales.  Musculoskeletal:     Cervical back: Normal range of motion and neck supple.  Lymphadenopathy:     Cervical: No cervical adenopathy.  Skin:    General: Skin is warm and dry.     Capillary Refill: Capillary refill takes less than 2 seconds.     Findings: No erythema or rash.  Neurological:     General: No focal deficit present.     Mental Status: She is alert and oriented to person, place, and time.      UC Treatments / Results  Labs (all labs ordered are listed, but only abnormal results are displayed) Labs Reviewed - No data to display  EKG   Radiology No results found.  Procedures Procedures (including critical care time)  Medications Ordered in UC Medications - No data to display  Initial Impression / Assessment and Plan / UC Course  I have reviewed the triage vital signs and the nursing notes.  Pertinent labs & imaging results that were available during my care of the patient were reviewed by me and considered in my medical decision making (see chart for details).  Patient is a pleasant, nontoxic-appearing 32 year old female presenting for evaluation of bilateral ear pain as outlined HPI above.  Her exam reveals inflamed nasal mucosa with clear rhinorrhea.  Oropharyngeal exam is benign.   Bilateral tympanic membranes are erythematous and injected.  Right greater than the left.  Both EACs are clear.  Her exam is consistent with an upper respiratory infection and bilateral otitis media.  I will start her on Augmentin 875 twice daily for 10 days for treatment of otitis media and her URI.  Return precautions reviewed.   Final Clinical Impressions(s) / UC Diagnoses   Final diagnoses:  Upper respiratory tract infection, unspecified type  Non-recurrent acute suppurative otitis media of both ears without spontaneous rupture of tympanic membranes     Discharge Instructions      Take the Augmentin twice daily for 10 days with food for treatment of your ear infection.  Take an over-the-counter probiotic 1 hour after each dose of antibiotic to prevent diarrhea.  Use over-the-counter Tylenol and ibuprofen as needed for pain or fever.  Place a hot water bottle, or heating pad, underneath your pillowcase at night to help dilate up your ear and aid in pain relief as well as resolution of the infection.  Return for reevaluation for any new or worsening symptoms.      ED Prescriptions     Medication Sig Dispense Auth. Provider   amoxicillin-clavulanate (AUGMENTIN) 875-125 MG tablet Take 1 tablet by mouth every 12 (twelve) hours for 10 days. 20 tablet Becky Augusta, NP      PDMP not reviewed this encounter.   Becky Augusta, NP 05/02/23 [phone]
